# Patient Record
Sex: Female | Born: 1980 | Race: White | Hispanic: No | Marital: Married | Smoking: Current every day smoker
Health system: Southern US, Community
[De-identification: ages and names within clinical notes are randomized; demographics above are authoritative.]

## PROBLEM LIST (undated history)

## (undated) DIAGNOSIS — R079 Chest pain, unspecified: Secondary | ICD-10-CM

## (undated) DIAGNOSIS — O24419 Gestational diabetes mellitus in pregnancy, unspecified control: Secondary | ICD-10-CM

## (undated) DIAGNOSIS — Z789 Other specified health status: Secondary | ICD-10-CM

## (undated) DIAGNOSIS — Z87442 Personal history of urinary calculi: Secondary | ICD-10-CM

## (undated) HISTORY — PX: OTHER SURGICAL HISTORY: SHX169

## (undated) HISTORY — DX: Chest pain, unspecified: R07.9

## (undated) HISTORY — DX: Other specified health status: Z78.9

## (undated) HISTORY — DX: Personal history of urinary calculi: Z87.442

## (undated) HISTORY — DX: Gestational diabetes mellitus in pregnancy, unspecified control: O24.419

---

## 2012-09-09 DIAGNOSIS — R87629 Unspecified abnormal cytological findings in specimens from vagina: Secondary | ICD-10-CM

## 2012-09-09 HISTORY — DX: Unspecified abnormal cytological findings in specimens from vagina: R87.629

## 2016-08-15 DIAGNOSIS — F17200 Nicotine dependence, unspecified, uncomplicated: Secondary | ICD-10-CM | POA: Insufficient documentation

## 2019-12-06 ENCOUNTER — Emergency Department (HOSPITAL_COMMUNITY): Payer: Managed Care, Other (non HMO)

## 2019-12-06 ENCOUNTER — Encounter (HOSPITAL_COMMUNITY): Payer: Self-pay | Admitting: Emergency Medicine

## 2019-12-06 ENCOUNTER — Other Ambulatory Visit: Payer: Self-pay

## 2019-12-06 ENCOUNTER — Emergency Department (HOSPITAL_COMMUNITY)
Admission: EM | Admit: 2019-12-06 | Discharge: 2019-12-07 | Disposition: A | Discharge status: Home / Self Care | Payer: Managed Care, Other (non HMO) | Attending: Emergency Medicine | Admitting: Emergency Medicine

## 2019-12-06 DIAGNOSIS — R0602 Shortness of breath: Secondary | ICD-10-CM | POA: Insufficient documentation

## 2019-12-06 DIAGNOSIS — R002 Palpitations: Secondary | ICD-10-CM | POA: Insufficient documentation

## 2019-12-06 DIAGNOSIS — F17200 Nicotine dependence, unspecified, uncomplicated: Secondary | ICD-10-CM | POA: Insufficient documentation

## 2019-12-06 DIAGNOSIS — R0789 Other chest pain: Secondary | ICD-10-CM

## 2019-12-06 LAB — BASIC METABOLIC PANEL
Anion gap: 9 (ref 5–15)
BUN: 7 mg/dL (ref 6–20)
CO2: 23 mmol/L (ref 22–32)
Calcium: 9.1 mg/dL (ref 8.9–10.3)
Chloride: 103 mmol/L (ref 98–111)
Creatinine, Ser: 0.65 mg/dL (ref 0.44–1.00)
GFR calc Af Amer: >60 mL/min (ref >60)
GFR calc non Af Amer: >60 mL/min (ref >60)
Glucose, Bld: 95 mg/dL (ref 70–99)
Potassium: 3.9 mmol/L (ref 3.5–5.1)
Sodium: 135 mmol/L (ref 135–145)

## 2019-12-06 LAB — CBC
HCT: 44.1 % (ref 36.0–46.0)
Hemoglobin: 15.3 g/dL — ABNORMAL HIGH (ref 12.0–15.0)
MCH: 32.4 pg (ref 26.0–34.0)
MCHC: 34.7 g/dL (ref 30.0–36.0)
MCV: 93.4 fL (ref 80.0–100.0)
Platelets: 310 10*3/uL (ref 150–400)
RBC: 4.72 MIL/uL (ref 3.87–5.11)
RDW: 12.9 % (ref 11.5–15.5)
WBC: 10.4 10*3/uL (ref 4.0–10.5)
nRBC: 0 % (ref 0.0–0.2)

## 2019-12-06 LAB — I-STAT BETA HCG BLOOD, ED (MC, WL, AP ONLY): I-stat hCG, quantitative: <5 m[IU]/mL (ref <5)

## 2019-12-06 LAB — TROPONIN I (HIGH SENSITIVITY)
Troponin I (High Sensitivity): 3 ng/L (ref <18)
Troponin I (High Sensitivity): 4 ng/L (ref <18)

## 2019-12-06 MED ORDER — SODIUM CHLORIDE 0.9% FLUSH: 3.0000 mL | Freq: Once | INTRAVENOUS | Status: DC

## 2019-12-06 NOTE — ED Triage Notes (Signed)
Pt to ED with c/o left chest pain onset this am.  Pt also c/o feeling shaky and off balance.  St's symptoms come and go

## 2019-12-06 NOTE — ED Notes (Signed)
Pt called for VS, no answer  

## 2019-12-07 MED ORDER — KETOROLAC TROMETHAMINE 60 MG/2ML IM SOLN
30.0000 mg | Freq: Once | INTRAMUSCULAR | Status: AC
  Administered 2019-12-07: 30 mg via INTRAMUSCULAR
  Filled 2019-12-07: qty 2

## 2019-12-07 NOTE — Discharge Instructions (Addendum)
Thank you for allowing me to care for you today in the Emergency Department.   You were seen today for chest pain.  Your work-up in the ER, as we discussed, was reassuring.  You can take 600 mg of ibuprofen with food every 6 hours as needed for pain.  You can follow-up with cardiology regarding the palpitations that you have been having for the last few months.  Their contact information is above.  Page 11 on your discharge paperwork has information on how to get set up with a primary care provider.  Try to cut back on smoking gradually.  For instance, if you smoke 10 cigarettes every day this week, next week try smoking only 9 cigarettes/day, then the next week cut back to 8 cigarettes/day.  You can also discuss other smoking cessation options with primary care.  Return to the emergency department if you develop severe difficulty breathing, if you pass out, if you develop severe chest pain that is constant with constant dizziness or lightheadedness, or other new, concerning symptoms.

## 2019-12-07 NOTE — ED Provider Notes (Signed)
[facility] EMERGENCY DEPARTMENT Provider Note   CSN: [id number] Arrival date & time: 12/06/19  1633     History Chief Complaint  Patient presents with  . Chest Pain    Nancy Peterson is a 39 y.o. female with a history of tobacco use disorder who presents to the emergency department with a chief complaint of chest pain.  The patient reports that she developed squeezing chest pain in the center of her chest that began on 2/28 at 10AM, more than 36 hours.  Pain has been constant since onset.  Pain is not pleuritic or exertional.  No known aggravating or alleviating factors. She has attempted to take Tums and Tylenol with no improvement in pain.  She reports that she has had mild dyspnea on exertion since onset of pain, which she noticed when she was walking from the parking lot into the ER.  She had a headache yesterday, but this has since resolved.  No cough, dizziness, lightheadedness, fever, chills, leg swelling, abdominal pain, nausea, vomiting, numbness, weakness, paresthesias, back pain, neck pain, or arm pain, or rashes.  No increased burping or belching.  No dizziness or lightheadedness.  No history of similar.  She reports that she has had palpitations that will last for less than 5 seconds and are accompanied by dizziness and lightheadedness for months.  Reports that she will have these episodes approximately 3-4 times per week.  Symptoms typically begin at rest.  She denies any symptoms at this time.  She does report that she drinks multiple caffeinated beverages daily.  Reports that she has been trying to cut back on caffeinated sodas at this time.  She reports that both of her parents had a history of atrial fibrillation.  She does not take oral contraceptives.  No recent surgery, immobilization, or travel.  No personal or familial history of VTE.  She does report that she has been under an increased amount of stress recently as one of her children is autistic and has  been in and out of the emergency department several times recently.  She has had 2 - Covid test over the last few months and has no known sick contacts at this time.  She does report that she is a current, every day tobacco user.  She has a history of childhood cough variant asthma, but has had no URI symptoms over the last week.  She has not established with primary care.  She does report that she had several alcoholic beverages the night before her pain began, but has not consumed any alcohol since that time.  Denies any other recreational or illicit substance use.  No other chronic medical conditions.  The history is provided by the patient. No language interpreter was used.  Chest Pain Associated symptoms: headache (resolved), palpitations (chronic, intermittent) and shortness of breath   Associated symptoms: no abdominal pain, no back pain, no dizziness, no dysphagia, no fever, no nausea, no vomiting and no weakness        History reviewed. No pertinent past medical history.  There are no problems to display for this patient.   History reviewed. No pertinent surgical history.   OB History   No obstetric history on file.     No family history on file.  Social History   Tobacco Use  . Smoking status: Current Every Day Smoker  . Smokeless tobacco: Never Used  Substance Use Topics  . Alcohol use: Yes  . Drug use: Never    Home  Medications Prior to Admission medications   Not on File    Allergies    Patient has no allergy information on record.  Review of Systems   Review of Systems  Constitutional: Negative for activity change, chills and fever.  HENT: Negative for congestion, sore throat, trouble swallowing and voice change.   Respiratory: Positive for shortness of breath.   Cardiovascular: Positive for chest pain and palpitations (chronic, intermittent). Negative for leg swelling.  Gastrointestinal: Negative for abdominal pain, diarrhea, nausea and vomiting.    Genitourinary: Negative for dysuria, frequency and urgency.  Musculoskeletal: Negative for arthralgias, back pain, gait problem, myalgias, neck pain and neck stiffness.  Skin: Negative for rash.  Allergic/Immunologic: Negative for immunocompromised state.  Neurological: Positive for headaches (resolved). Negative for dizziness, seizures, syncope, speech difficulty, weakness and light-headedness.  Psychiatric/Behavioral: Negative for confusion.    Physical Exam Updated Vital Signs BP 124/70   Pulse 86   Temp 98.3 F (36.8 C) (Oral)   Resp 16   Ht 5\' 4"  (1.626 m)   Wt 79.4 kg   LMP 11/08/2019 (Exact Date)   SpO2 100%   BMI 30.04 kg/m   Physical Exam Vitals and nursing note reviewed.  Constitutional:      General: She is not in acute distress.    Appearance: She is not ill-appearing, toxic-appearing or diaphoretic.     Comments: Anxious, but otherwise well-appearing.  HENT:     Head: Normocephalic.  Eyes:     Conjunctiva/sclera: Conjunctivae normal.  Cardiovascular:     Rate and Rhythm: Normal rate and regular rhythm.     Pulses: Normal pulses.     Heart sounds: Normal heart sounds. No murmur. No friction rub. No gallop.      Comments: Peripheral pulses are 2+ and symmetric. Pulmonary:     Effort: Pulmonary effort is normal. No respiratory distress.     Breath sounds: No stridor. No wheezing, rhonchi or rales.     Comments: Lungs are clear to auscultation bilaterally.  No increased work of breathing. Chest:     Chest wall: No tenderness.  Abdominal:     General: There is no distension.     Palpations: Abdomen is soft. There is no mass.     Tenderness: There is no abdominal tenderness. There is no right CVA tenderness, left CVA tenderness, guarding or rebound.     Hernia: No hernia is present.  Musculoskeletal:     Cervical back: Neck supple.  Skin:    General: Skin is warm.     Findings: No rash.  Neurological:     Mental Status: She is alert.  Psychiatric:         Behavior: Behavior normal.     ED Results / Procedures / Treatments   Labs (all labs ordered are listed, but only abnormal results are displayed) Labs Reviewed  CBC - Abnormal; Notable for the following components:      Result Value   Hemoglobin 15.3 (*)    All other components within normal limits  BASIC METABOLIC PANEL  I-STAT BETA HCG BLOOD, ED ([facility], [facility], [facility] ONLY)  TROPONIN I (HIGH SENSITIVITY)  TROPONIN I (HIGH SENSITIVITY)    EKG EKG Interpretation  Date/Time:  Monday December 06 2019 17:06:08 EDT Ventricular Rate:  74 PR Interval:  148 QRS Duration: 84 QT Interval:  392 QTC Calculation: 435 R Axis:   72 Text Interpretation: Normal sinus rhythm with sinus arrhythmia Normal ECG No old tracing to compare Confirmed by Merrily Pew [phone])  on 12/07/2019 12:38:19 AM   Radiology DG Chest 2 View  Result Date: 12/06/2019 CLINICAL DATA:  Chest pain EXAM: CHEST - 2 VIEW COMPARISON:  None. FINDINGS: The heart size and mediastinal contours are within normal limits. Both lungs are clear. The visualized skeletal structures are unremarkable. IMPRESSION: No active cardiopulmonary disease. Electronically Signed   By: Katherine Mantle M.D.   On: 12/06/2019 17:56    Procedures Procedures (including critical care time)  Medications Ordered in ED Medications  sodium chloride flush (NS) 0.9 % injection 3 mL (3 mLs Intravenous Not Given 12/07/19 0125)  ketorolac (TORADOL) injection 30 mg (30 mg Intramuscular Given 12/07/19 0127)    ED Course  I have reviewed the triage vital signs and the nursing notes.  Pertinent labs & imaging results that were available during my care of the patient were reviewed by me and considered in my medical decision making (see chart for details).    MDM Rules/Calculators/A&P                      39 year old female with a history of tobacco use disorder presenting with squeezing, central chest pain since yesterday.  She noticed that she felt mildly short  of breath when she was walking through the parking lot into the ER.  She had a headache yesterday that is since resolved.  No other associated symptoms.  Vital signs are unremarkable.  EKG with normal sinus rhythm.  She has been having intermittent episodes of palpitations over the last few months, but is having none at this time.  No evidence of Brugada syndrome on EKG.  Troponin is normal.  Heart score is 2.  Labs are otherwise reassuring.  Chest x-ray is unremarkable.  She is PERC negative.  Doubt ACS, PE, tension pneumothorax, aortic dissection, or pneumonia.  She was given Toradol and on reevaluation pain has improved.  Symptoms are very atypical for ACS.  Any she would benefit from getting established with primary care for regular follow-up.  She also has been having intermittent episodes of palpitations for the last few months.  No associated episodes at this time.  Will refer to cardiology as she may benefit from Holter monitor.  I also offered the patient COVID-19 testing given that she has had chest pain, but declined.  Patient was ambulated in the ER without hypoxia.  After ambulating, she reports that she did not feel short of breath during this..  Given improvement with Toradol and reassuring work-up, will discharge patient at this time to home.  She is hemodynamically stable and in no acute distress.  All questions answered.  Safe for discharge home with outpatient follow-up.   Final Clinical Impression(s) / ED Diagnoses Final diagnoses:  Atypical chest pain  Palpitations    Rx / DC Orders ED Discharge Orders    None       Barkley Boards, PA-C 12/07/19 0253    Mesner, Barbara Cower, MD 12/07/19 [phone]

## 2019-12-07 NOTE — ED Notes (Signed)
Pt O2 sats remained at 100% while ambulating

## 2019-12-24 ENCOUNTER — Ambulatory Visit: Payer: Managed Care, Other (non HMO) | Admitting: Cardiology

## 2020-01-13 DIAGNOSIS — R072 Precordial pain: Secondary | ICD-10-CM | POA: Insufficient documentation

## 2020-01-13 DIAGNOSIS — Z7189 Other specified counseling: Secondary | ICD-10-CM | POA: Insufficient documentation

## 2020-01-13 NOTE — Progress Notes (Deleted)
    Cardiology Office Note   Date:  01/13/2020   ID:  [name], DOB [birth year], MRN [id number]  PCP:  Patient, No Pcp Per  Cardiologist:   No primary care provider on file. Referring:  ***  No chief complaint on file.     History of Present Illness: Nancy Peterson is a 39 y.o. female who is referred by *** for evaluation of chest pain.  She was in the ED with this.  I reviewed these records for this visit.  Her HEART score was 2.  ***  Past Medical History:  Diagnosis Date  . Chest pain     No past surgical history on file.   No current outpatient medications on file.   No current facility-administered medications for this visit.    Allergies:   Patient has no allergy information on record.    Social History:  The patient  reports that she has been smoking. She has never used smokeless tobacco. She reports current alcohol use. She reports that she does not use drugs.   Family History:  The patient's ***family history is not on file.    ROS:  Please see the history of present illness.   Otherwise, review of systems are positive for {NONE DEFAULTED:18576::"none"}.   All other systems are reviewed and negative.    PHYSICAL EXAM: VS:  There were no vitals taken for this visit. , BMI There is no height or weight on file to calculate BMI. GENERAL:  Well appearing HEENT:  Pupils equal round and reactive, fundi not visualized, oral mucosa unremarkable NECK:  No jugular venous distention, waveform within normal limits, carotid upstroke brisk and symmetric, no bruits, no thyromegaly LYMPHATICS:  No cervical, inguinal adenopathy LUNGS:  Clear to auscultation bilaterally BACK:  No CVA tenderness CHEST:  Unremarkable HEART:  PMI not displaced or sustained,S1 and S2 within normal limits, no S3, no S4, no clicks, no rubs, *** murmurs ABD:  Flat, positive bowel sounds normal in frequency in pitch, no bruits, no rebound, no guarding, no midline pulsatile mass, no hepatomegaly, no  splenomegaly EXT:  2 plus pulses throughout, no edema, no cyanosis no clubbing SKIN:  No rashes no nodules NEURO:  Cranial nerves II through XII grossly intact, motor grossly intact throughout PSYCH:  Cognitively intact, oriented to person place and time    EKG:  EKG {ACTION; IS/IS OEU:[id number]} ordered today. The ekg ordered today demonstrates ***   Recent Labs: 12/06/2019: BUN 7; Creatinine, Ser 0.65; Hemoglobin 15.3; Platelets 310; Potassium 3.9; Sodium 135    Lipid Panel No results found for: CHOL, TRIG, HDL, CHOLHDL, VLDL, LDLCALC, LDLDIRECT    Wt Readings from Last 3 Encounters:  12/06/19 175 lb (79.4 kg)      Other studies Reviewed: Additional studies/ records that were reviewed today include: ***. Review of the above records demonstrates:  Please see elsewhere in the note.  ***   ASSESSMENT AND PLAN:  CHEST PAIN:  ***  COVID EDUCATION:  ***   Current medicines are reviewed at length with the patient today.  The patient {ACTIONS; HAS/DOES NOT HAVE:19233} concerns regarding medicines.  The following changes have been made:  {PLAN; NO CHANGE:13088:s}  Labs/ tests ordered today include: *** No orders of the defined types were placed in this encounter.    Disposition:   FU with ***    Signed, Rollene Rotunda, MD  01/13/2020 9:24 PM    [facility]

## 2020-01-14 ENCOUNTER — Ambulatory Visit: Payer: Managed Care, Other (non HMO) | Admitting: Cardiology

## 2020-01-18 ENCOUNTER — Encounter: Payer: Self-pay | Admitting: General Practice

## 2020-04-13 ENCOUNTER — Emergency Department (HOSPITAL_COMMUNITY)
Admission: EM | Admit: 2020-04-13 | Discharge: 2020-04-13 | Disposition: A | Discharge status: Home / Self Care | Payer: Managed Care, Other (non HMO) | Attending: Emergency Medicine | Admitting: Emergency Medicine

## 2020-04-13 ENCOUNTER — Emergency Department (HOSPITAL_COMMUNITY): Payer: Managed Care, Other (non HMO)

## 2020-04-13 ENCOUNTER — Encounter (HOSPITAL_COMMUNITY): Payer: Self-pay | Admitting: *Deleted

## 2020-04-13 DIAGNOSIS — F17200 Nicotine dependence, unspecified, uncomplicated: Secondary | ICD-10-CM | POA: Diagnosis not present

## 2020-04-13 DIAGNOSIS — R1032 Left lower quadrant pain: Secondary | ICD-10-CM | POA: Diagnosis not present

## 2020-04-13 DIAGNOSIS — N139 Obstructive and reflux uropathy, unspecified: Secondary | ICD-10-CM | POA: Diagnosis not present

## 2020-04-13 DIAGNOSIS — Z79899 Other long term (current) drug therapy: Secondary | ICD-10-CM | POA: Insufficient documentation

## 2020-04-13 DIAGNOSIS — R1012 Left upper quadrant pain: Secondary | ICD-10-CM | POA: Diagnosis not present

## 2020-04-13 DIAGNOSIS — R319 Hematuria, unspecified: Secondary | ICD-10-CM | POA: Diagnosis not present

## 2020-04-13 DIAGNOSIS — R300 Dysuria: Secondary | ICD-10-CM | POA: Diagnosis not present

## 2020-04-13 DIAGNOSIS — R1030 Lower abdominal pain, unspecified: Secondary | ICD-10-CM | POA: Diagnosis present

## 2020-04-13 DIAGNOSIS — N135 Crossing vessel and stricture of ureter without hydronephrosis: Secondary | ICD-10-CM

## 2020-04-13 LAB — BASIC METABOLIC PANEL
Anion gap: 8 (ref 5–15)
BUN: 5 mg/dL — ABNORMAL LOW (ref 6–20)
CO2: 25 mmol/L (ref 22–32)
Calcium: 10 mg/dL (ref 8.9–10.3)
Chloride: 104 mmol/L (ref 98–111)
Creatinine, Ser: 0.62 mg/dL (ref 0.44–1.00)
GFR calc Af Amer: >60 mL/min (ref >60)
GFR calc non Af Amer: >60 mL/min (ref >60)
Glucose, Bld: 68 mg/dL — ABNORMAL LOW (ref 70–99)
Potassium: 4.1 mmol/L (ref 3.5–5.1)
Sodium: 137 mmol/L (ref 135–145)

## 2020-04-13 LAB — CBG MONITORING, ED: Glucose-Capillary: 95 mg/dL (ref 70–99)

## 2020-04-13 LAB — I-STAT BETA HCG BLOOD, ED (MC, WL, AP ONLY): I-stat hCG, quantitative: <5 m[IU]/mL (ref <5)

## 2020-04-13 LAB — URINALYSIS, ROUTINE W REFLEX MICROSCOPIC
Bilirubin Urine: NEGATIVE
Glucose, UA: NEGATIVE mg/dL
Ketones, ur: NEGATIVE mg/dL
Leukocytes,Ua: NEGATIVE
Nitrite: NEGATIVE
Protein, ur: NEGATIVE mg/dL
Specific Gravity, Urine: 1.002 — ABNORMAL LOW (ref 1.005–1.030)
pH: 7 (ref 5.0–8.0)

## 2020-04-13 LAB — CBC
HCT: 45.3 % (ref 36.0–46.0)
Hemoglobin: 15.4 g/dL — ABNORMAL HIGH (ref 12.0–15.0)
MCH: 32.3 pg (ref 26.0–34.0)
MCHC: 34 g/dL (ref 30.0–36.0)
MCV: 95 fL (ref 80.0–100.0)
Platelets: 336 10*3/uL (ref 150–400)
RBC: 4.77 MIL/uL (ref 3.87–5.11)
RDW: 12.9 % (ref 11.5–15.5)
WBC: 11 10*3/uL — ABNORMAL HIGH (ref 4.0–10.5)
nRBC: 0 % (ref 0.0–0.2)

## 2020-04-13 MED ORDER — CIPROFLOXACIN HCL 500 MG PO TABS: 500.0000 mg | ORAL_TABLET | Freq: Two times a day (BID) | ORAL | 0 refills | Status: DC

## 2020-04-13 MED ORDER — HYDROCODONE-ACETAMINOPHEN 5-325 MG PO TABS: 1.0000 | ORAL_TABLET | Freq: Three times a day (TID) | ORAL | 0 refills | Status: DC | PRN

## 2020-04-13 MED ORDER — ONDANSETRON 4 MG PO TBDP
4.0000 mg | ORAL_TABLET | Freq: Once | ORAL | Status: AC
  Administered 2020-04-13: 4 mg via ORAL
  Filled 2020-04-13: qty 1

## 2020-04-13 MED ORDER — ONDANSETRON HCL 4 MG PO TABS: 4.0000 mg | ORAL_TABLET | Freq: Four times a day (QID) | ORAL | 0 refills | Status: DC

## 2020-04-13 MED ORDER — CIPROFLOXACIN HCL 500 MG PO TABS
500.0000 mg | ORAL_TABLET | Freq: Two times a day (BID) | ORAL | 0 refills | Status: AC
Start: 2020-04-13 — End: 2020-04-20

## 2020-04-13 MED ORDER — ONDANSETRON HCL 4 MG PO TABS
4.0000 mg | ORAL_TABLET | Freq: Four times a day (QID) | ORAL | 0 refills | Status: DC
Start: 2020-04-13 — End: 2021-03-27

## 2020-04-13 MED ORDER — KETOROLAC TROMETHAMINE 30 MG/ML IJ SOLN
30.0000 mg | Freq: Once | INTRAMUSCULAR | Status: AC
  Administered 2020-04-13: 30 mg via INTRAMUSCULAR
  Filled 2020-04-13: qty 1

## 2020-04-13 NOTE — ED Triage Notes (Signed)
To ED for eval right flank pain. States she has been on 7 days of abx. Told if she developed flank pain to come to the ED. No fevers.

## 2020-04-13 NOTE — ED Notes (Signed)
Pt tolerating PO intake

## 2020-04-13 NOTE — ED Provider Notes (Signed)
[facility] EMERGENCY DEPARTMENT Provider Note   CSN: [id number] Arrival date & time: 04/13/20  1002     History Chief Complaint  Patient presents with  . Flank Pain    Nancy Peterson is a 39 y.o. female.  HPI   39 year old female who presents to the emergency department today for evaluation of left flank pain.  She states that 1 week ago she started having dysuria, frequency, urgency and hematuria.  She has had UTIs in the past and felt that her symptoms were similar so she had a telehealth visit with a provider who started her on Macrobid.  States she just finished this medication but is now developed left flank pain that started this morning.  Symptoms have been intermittent since onset.  States pain radiates around to the left side of the abdomen.  She also had some nausea but no episodes of vomiting.  Denies any diarrhea or constipation.  She has never had a kidney stone in the past.  Past Medical History:  Diagnosis Date  . Chest pain     Patient Active Problem List   Diagnosis Date Noted  . Precordial chest pain 01/13/2020  . Educated about COVID-19 virus infection 01/13/2020    History reviewed. No pertinent surgical history.   OB History   No obstetric history on file.     No family history on file.  Social History   Tobacco Use  . Smoking status: Current Every Day Smoker  . Smokeless tobacco: Never Used  Substance Use Topics  . Alcohol use: Yes  . Drug use: Never    Home Medications Prior to Admission medications   Medication Sig Start Date End Date Taking? Authorizing Provider  CRANBERRY PO Take 2 tablets by mouth daily.   Yes [provider]  ciprofloxacin (CIPRO) 500 MG tablet Take 1 tablet (500 mg total) by mouth every 12 (twelve) hours for 7 days. 04/13/20 04/20/20  Tyler Cubit S, PA-C  HYDROcodone-acetaminophen (NORCO/VICODIN) 5-325 MG tablet Take 1 tablet by mouth every 8 (eight) hours as needed. 04/13/20   Jahson Emanuele  S, PA-C  ondansetron (ZOFRAN) 4 MG tablet Take 1 tablet (4 mg total) by mouth every 6 (six) hours. 04/13/20   Shooter Tangen S, PA-C    Allergies    Penicillins  Review of Systems   Review of Systems  Constitutional: Negative for fever.  HENT: Negative for ear pain and sore throat.   Eyes: Negative for visual disturbance.  Respiratory: Negative for cough and shortness of breath.   Cardiovascular: Negative for chest pain.  Gastrointestinal: Positive for abdominal pain and nausea. Negative for constipation, diarrhea and vomiting.  Genitourinary: Positive for dysuria, flank pain, frequency, hematuria and urgency.  Musculoskeletal: Negative for back pain.  Skin: Negative for rash.  Neurological: Negative for headaches.  All other systems reviewed and are negative.   Physical Exam Updated Vital Signs BP 132/89 (BP Location: Right Arm)   Pulse 64   Temp 98.2 F (36.8 C) (Oral)   Resp 18   LMP 03/09/2020 (Approximate)   SpO2 97%   Physical Exam Vitals and nursing note reviewed.  Constitutional:      General: She is not in acute distress.    Appearance: She is well-developed.  HENT:     Head: Normocephalic and atraumatic.  Eyes:     Conjunctiva/sclera: Conjunctivae normal.  Cardiovascular:     Rate and Rhythm: Normal rate and regular rhythm.     Heart sounds: Normal heart sounds.  No murmur heard.   Pulmonary:     Effort: Pulmonary effort is normal. No respiratory distress.     Breath sounds: Normal breath sounds. No wheezing, rhonchi or rales.  Abdominal:     General: Bowel sounds are normal.     Palpations: Abdomen is soft.     Tenderness: There is abdominal tenderness (LUQ, LLQ). There is left CVA tenderness. There is no right CVA tenderness, guarding or rebound.  Musculoskeletal:     Cervical back: Neck supple.  Skin:    General: Skin is warm and dry.  Neurological:     Mental Status: She is alert.     ED Results / Procedures / Treatments   Labs (all labs  ordered are listed, but only abnormal results are displayed) Labs Reviewed  URINALYSIS, ROUTINE W REFLEX MICROSCOPIC - Abnormal; Notable for the following components:      Result Value   Color, Urine STRAW (*)    Specific Gravity, Urine 1.002 (*)    Hgb urine dipstick LARGE (*)    Bacteria, UA RARE (*)    All other components within normal limits  CBC - Abnormal; Notable for the following components:   WBC 11.0 (*)    Hemoglobin 15.4 (*)    All other components within normal limits  BASIC METABOLIC PANEL - Abnormal; Notable for the following components:   Glucose, Bld 68 (*)    BUN 5 (*)    All other components within normal limits  URINE CULTURE  I-STAT BETA HCG BLOOD, ED ([facility], [facility], [facility] ONLY)  CBG MONITORING, ED    EKG None  Radiology CT Renal Stone Study  Result Date: 04/13/2020 CLINICAL DATA:  Right flank pain EXAM: CT ABDOMEN AND PELVIS WITHOUT CONTRAST TECHNIQUE: Multidetector CT imaging of the abdomen and pelvis was performed following the standard protocol without IV contrast. COMPARISON:  Chest radiograph 12/06/2019 FINDINGS: Lower chest: Lung bases are clear. Normal heart size. No pericardial effusion. Hepatobiliary: No visible concerning focal liver lesions on this unenhanced CT. Gradient density within the gallbladder may reflect some layering biliary sludge. No pericholecystic fluid or inflammation. No biliary ductal dilatation or visible calcified gallstones. Pancreas: Unremarkable. No pancreatic ductal dilatation or surrounding inflammatory changes. Spleen: Normal in size. No concerning splenic lesions. Adrenals/Urinary Tract: Normal adrenal glands. Kidneys are symmetric in size and normally located. No acute abnormality of the right kidney. Multiple nonobstructing calculi present in the left kidney. Slight prominence of the left renal pelvis with abrupt transition to more decompressed ureter at the level of the ureteropelvic junction, could reflect a mild UPJ obstruction.  Multiple phleboliths noted in the vicinity of the distal left ureter but without visible distal ureteral calculus convincingly demonstrated. Urinary bladder is largely decompressed at the time of exam and therefore poorly evaluated by CT imaging. Mild wall thickening is nonspecific and possibly related to underdistention versus cystitis. Stomach/Bowel: Distal esophagus, stomach and duodenal sweep are unremarkable. No small bowel wall thickening or dilatation. No evidence of obstruction. Cecum displaced to the midline pelvis with a normal appendix along the right pelvic sidewall. No colonic dilatation or wall thickening. Vascular/Lymphatic: No significant vascular findings are present. No enlarged abdominal or pelvic lymph nodes. Reproductive: Uterus and bilateral adnexa are unremarkable. Other: No abdominopelvic free fluid or free gas. No bowel containing hernias. Musculoskeletal: No acute osseous abnormality or suspicious osseous lesion. IMPRESSION: 1. Mild wall thickening of the urinary bladder, nonspecific and possibly related to underdistention versus cystitis. 2. No acute abnormality of the right kidney  or ureter. 3. Slight prominence of the left renal pelvis with abrupt transition to more decompressed ureter at the level of the ureteropelvic junction, could reflect a mild UPJ obstruction. 4. Nonobstructing calculi present in left renal pelvis. Multiple phleboliths noted in the vicinity of the distal left ureter but without visible distal ureteral calculus convincingly demonstrated. 5. Gradient density within the gallbladder may reflect some layering biliary sludge. No pericholecystic fluid or inflammation. Electronically Signed   By: Kreg Shropshire M.D.   On: 04/13/2020 18:54    Procedures Procedures (including critical care time)  Medications Ordered in ED Medications  ketorolac (TORADOL) 30 MG/ML injection 30 mg (30 mg Intramuscular Given 04/13/20 1649)  ondansetron (ZOFRAN-ODT) disintegrating tablet 4  mg (4 mg Oral Given 04/13/20 1649)    ED Course  I have reviewed the triage vital signs and the nursing notes.  Pertinent labs & imaging results that were available during my care of the patient were reviewed by me and considered in my medical decision making (see chart for details).    MDM Rules/Calculators/A&P                          39 year old female presenting for evaluation of left flank pain.  Recently treated for UTI with Macrobid.  Reviewed/interpreted labs CBC with mild leukocytosis 11, mildly elevated hemoglobin BMP with normal electrolytes and kidney function.  Blood glucose slightly low initially, patient given p.o. fluids and repeat CBG within normal limits. Beta hcg neg UA with hematuria, otherwise no leuks, nitrites urine culture obtained  CT renal -  1. Mild wall thickening of the urinary bladder, nonspecific and possibly related to underdistention versus cystitis. 2. No acute abnormality of the right kidney or ureter. 3. Slight prominence of the left renal pelvis with abrupt transition to more decompressed ureter at the level of the ureteropelvic junction, could reflect a mild UPJ obstruction. 4. Nonobstructing calculi present in left renal pelvis. Multiple phleboliths noted in the vicinity of the distal left ureter but without visible distal ureteral calculus convincingly demonstrated. 5. Gradient density within the gallbladder may reflect some layering biliary sludge. No pericholecystic fluid or inflammation.   7:28 PM CONSULT with Dr. Benancio Deeds with urology.  He reviewed the patient's work-up and imaging.  He recommends starting the patient on Cipro for 7 days to cover for possible pyelonephritis and also recommends that she follow-up in the office for further evaluation.  Recommends culturing the urine.  Reassessed patient, she has been able to tolerate p.o.  We discussed the findings and plan for discharge with close follow-up with urology.  Discussed plan to start her on  Cipro, pain medications and will also send her home with nausea medications.  Have advised that she call the office tomorrow to schedule appointment for follow-up.  We discussed strict return precautions for any new or worsening symptoms.  She voices understanding of the plan and reasons to return.  All questions answered.  Patient stable for discharge.  Final Clinical Impression(s) / ED Diagnoses Final diagnoses:  Obstruction of left ureter    Rx / DC Orders ED Discharge Orders         Ordered    ciprofloxacin (CIPRO) 500 MG tablet  Every 12 hours     Discontinue  Reprint     04/13/20 1937    HYDROcodone-acetaminophen (NORCO/VICODIN) 5-325 MG tablet  Every 8 hours PRN     Discontinue  Reprint     04/13/20 1937  ondansetron (ZOFRAN) 4 MG tablet  Every 6 hours     Discontinue  Reprint     04/13/20 1937           Rayne DuCouture, Betzabe Bevans S, PA-C 04/13/20 1938    Terald Sleeperrifan, Matthew J, MD 04/13/20 [phone]

## 2020-04-13 NOTE — Discharge Instructions (Addendum)
You were given a prescription for antibiotics. Please take the antibiotic prescription fully.   Prescription given for Norco. Take medication as directed and do not operate machinery, drive a car, or work while taking this medication as it can make you drowsy.   You were given a prescription for zofran to help with your nausea. Please take as directed.  Please follow up with [facility] within 5-7 days for re-evaluation of your symptoms. If you do not have a primary care provider, information for a healthcare clinic has been provided for you to make arrangements for follow up care.    You will need to return to the emergency department for any fevers, persistent pain, persistent vomiting, inability to urinate, or any new or worsening symptoms.

## 2020-04-13 NOTE — ED Notes (Signed)
Patient verbalizes understanding of discharge instructions. Opportunity for questioning and answers were provided. Armband removed by staff, pt discharged from ED ambulatory.   

## 2020-04-14 LAB — URINE CULTURE

## 2021-02-27 ENCOUNTER — Ambulatory Visit (INDEPENDENT_AMBULATORY_CARE_PROVIDER_SITE_OTHER): Payer: Managed Care, Other (non HMO) | Admitting: *Deleted

## 2021-02-27 ENCOUNTER — Other Ambulatory Visit: Payer: Self-pay

## 2021-02-27 VITALS — BP 131/80 | HR 82 | Temp 98.0°F | Ht 63.0 in | Wt 177.0 lb

## 2021-02-27 DIAGNOSIS — Z3480 Encounter for supervision of other normal pregnancy, unspecified trimester: Secondary | ICD-10-CM

## 2021-02-27 DIAGNOSIS — Z3201 Encounter for pregnancy test, result positive: Secondary | ICD-10-CM | POA: Diagnosis not present

## 2021-02-27 DIAGNOSIS — O0990 Supervision of high risk pregnancy, unspecified, unspecified trimester: Secondary | ICD-10-CM | POA: Insufficient documentation

## 2021-02-27 LAB — POCT URINE PREGNANCY: Preg Test, Ur: POSITIVE — AB

## 2021-02-27 MED ORDER — PRENATAL PLUS VITAMIN/MINERAL 27-1 MG PO TABS: 1.0000 | ORAL_TABLET | Freq: Every day | ORAL | 12 refills | Status: AC

## 2021-02-27 NOTE — Progress Notes (Signed)
   Location: [facility] Patient: clinic Provider: clinic  PRENATAL INTAKE SUMMARY  Nancy Peterson presents today New OB Nurse Interview.  OB History     Gravida  3   Para  2   Term  2   Preterm      AB      Living  2      SAB      IAB      Ectopic      Multiple      Live Births  2          I have reviewed the patient's medical, obstetrical, social, and family histories, medications, and available lab results.  SUBJECTIVE She has no unusual complaints. Patient reported she had an abnormal PAP with last pregnancy.   OBJECTIVE Initial Nurse interview for history/labs (New OB)  EDD: 10/15/2021 by LMP GA: [redacted]w[redacted]d G3P2002  GENERAL APPEARANCE: alert, well appearing, in no apparent distress, oriented to person, place and time   ASSESSMENT Positive urine pregnancy test Normal pregnancy  PLAN Prenatal care:  [facility] Labs to be completed at next visit with Dr. Crissie Reese on 03/27/21 at 2:55 PM Patient to sign up for Babyscripts Rx PNV sent to pharmacy Blood pressure monitor given Advised to ask for ROI for previous prenatal records at [facility] appointment  Follow Up Instructions:   I discussed the assessment and treatment plan with the patient. The patient was provided an opportunity to ask questions and all were answered. The patient agreed with the plan and demonstrated an understanding of the instructions.   The patient was advised to call back or seek an in-person evaluation if the symptoms worsen or if the condition fails to improve as anticipated.  I provided 30 minutes of  face-to-face time during this encounter.  Clovis Pu, RN

## 2021-03-22 ENCOUNTER — Encounter (HOSPITAL_COMMUNITY): Payer: Self-pay | Admitting: Obstetrics & Gynecology

## 2021-03-22 ENCOUNTER — Inpatient Hospital Stay (HOSPITAL_COMMUNITY)
Admission: AD | Admit: 2021-03-22 | Discharge: 2021-03-22 | Disposition: A | Discharge status: Home / Self Care | Payer: Managed Care, Other (non HMO) | Attending: Obstetrics & Gynecology | Admitting: Obstetrics & Gynecology

## 2021-03-22 ENCOUNTER — Inpatient Hospital Stay (HOSPITAL_COMMUNITY): Payer: Managed Care, Other (non HMO)

## 2021-03-22 ENCOUNTER — Other Ambulatory Visit: Payer: Self-pay

## 2021-03-22 DIAGNOSIS — O4691 Antepartum hemorrhage, unspecified, first trimester: Secondary | ICD-10-CM

## 2021-03-22 DIAGNOSIS — O99331 Smoking (tobacco) complicating pregnancy, first trimester: Secondary | ICD-10-CM | POA: Insufficient documentation

## 2021-03-22 DIAGNOSIS — F17210 Nicotine dependence, cigarettes, uncomplicated: Secondary | ICD-10-CM | POA: Diagnosis not present

## 2021-03-22 DIAGNOSIS — O208 Other hemorrhage in early pregnancy: Secondary | ICD-10-CM | POA: Diagnosis not present

## 2021-03-22 DIAGNOSIS — O209 Hemorrhage in early pregnancy, unspecified: Secondary | ICD-10-CM

## 2021-03-22 DIAGNOSIS — O418X10 Other specified disorders of amniotic fluid and membranes, first trimester, not applicable or unspecified: Secondary | ICD-10-CM

## 2021-03-22 DIAGNOSIS — Z3A10 10 weeks gestation of pregnancy: Secondary | ICD-10-CM | POA: Insufficient documentation

## 2021-03-22 DIAGNOSIS — O468X1 Other antepartum hemorrhage, first trimester: Secondary | ICD-10-CM

## 2021-03-22 LAB — COMPREHENSIVE METABOLIC PANEL
ALT: 28 U/L (ref 0–44)
AST: 30 U/L (ref 15–41)
Albumin: 3.7 g/dL (ref 3.5–5.0)
Alkaline Phosphatase: 43 U/L (ref 38–126)
Anion gap: 9 (ref 5–15)
BUN: 5 mg/dL — ABNORMAL LOW (ref 6–20)
CO2: 23 mmol/L (ref 22–32)
Calcium: 9.3 mg/dL (ref 8.9–10.3)
Chloride: 101 mmol/L (ref 98–111)
Creatinine, Ser: 0.51 mg/dL (ref 0.44–1.00)
GFR, Estimated: >60 mL/min (ref >60)
Glucose, Bld: 93 mg/dL (ref 70–99)
Potassium: 3.8 mmol/L (ref 3.5–5.1)
Sodium: 133 mmol/L — ABNORMAL LOW (ref 135–145)
Total Bilirubin: 0.5 mg/dL (ref 0.3–1.2)
Total Protein: 6.9 g/dL (ref 6.5–8.1)

## 2021-03-22 LAB — ABO/RH: ABO/RH(D): O POS

## 2021-03-22 LAB — WET PREP, GENITAL
Clue Cells Wet Prep HPF POC: NONE SEEN
Sperm: NONE SEEN
Trich, Wet Prep: NONE SEEN
Yeast Wet Prep HPF POC: NONE SEEN

## 2021-03-22 LAB — HCG, QUANTITATIVE, PREGNANCY: hCG, Beta Chain, Quant, S: 88700 m[IU]/mL — ABNORMAL HIGH (ref <5)

## 2021-03-22 LAB — CBC
HCT: 39.5 % (ref 36.0–46.0)
Hemoglobin: 14.2 g/dL (ref 12.0–15.0)
MCH: 32.6 pg (ref 26.0–34.0)
MCHC: 35.9 g/dL (ref 30.0–36.0)
MCV: 90.6 fL (ref 80.0–100.0)
Platelets: 311 10*3/uL (ref 150–400)
RBC: 4.36 MIL/uL (ref 3.87–5.11)
RDW: 13.1 % (ref 11.5–15.5)
WBC: 13.1 10*3/uL — ABNORMAL HIGH (ref 4.0–10.5)
nRBC: 0 % (ref 0.0–0.2)

## 2021-03-22 NOTE — MAU Provider Note (Signed)
Chief Complaint: Vaginal Bleeding   Event Date/Time   First Provider Initiated Contact with Patient 03/22/21 0202        SUBJECTIVE HPI: Nancy Peterson is a 40 y.o. G3P2002 at [redacted]w[redacted]d by LMP who presents to maternity admissions reporting vaginal bleeding while at work.  Had some cramping but none now. No recent IC. She denies vaginal itching/burning, urinary symptoms, h/a, dizziness, n/v, or fever/chills.    Vaginal Bleeding The patient's primary symptoms include pelvic pain and vaginal bleeding. The patient's pertinent negatives include no genital itching, genital lesions or genital odor. This is a new problem. The current episode started today. The problem occurs intermittently. The problem has been rapidly improving. The patient is experiencing no pain. She is pregnant. Pertinent negatives include no chills, constipation, diarrhea, dysuria, fever, nausea or vomiting. The vaginal discharge was bloody. The vaginal bleeding is heavier than menses. She has been passing clots. She has not been passing tissue. Nothing aggravates the symptoms. She has tried nothing for the symptoms.   RN Note: Patient was at work and began bleeding. She noticed she had soaked her pants with some clots. Has had some abdominal cramping. Last intercourse: last weekend 4/10  Past Medical History:  Diagnosis Date   Chest pain    Medical history non-contributory    Past Surgical History:  Procedure Laterality Date   KidneyStone Removal     Social History   Socioeconomic History   Marital status: Married    Spouse name: Not on file   Number of children: 2   Years of education: Not on file   Highest education level: Not on file  Occupational History   Not on file  Tobacco Use   Smoking status: Every Day    Types: Cigarettes   Smokeless tobacco: Never  Vaping Use   Vaping Use: Never used  Substance and Sexual Activity   Alcohol use: Not Currently   Drug use: Never   Sexual activity: Yes    Birth  control/protection: None  Other Topics Concern   Not on file  Social History Narrative   Not on file   Social Determinants of Health   Financial Resource Strain: Not on file  Food Insecurity: Not on file  Transportation Needs: Not on file  Physical Activity: Not on file  Stress: Not on file  Social Connections: Not on file  Intimate Partner Violence: Not on file   No current facility-administered medications on file prior to encounter.   Current Outpatient Medications on File Prior to Encounter  Medication Sig Dispense Refill   HYDROcodone-acetaminophen (NORCO/VICODIN) 5-325 MG tablet Take 1 tablet by mouth every 8 (eight) hours as needed. 6 tablet 0   ondansetron (ZOFRAN) 4 MG tablet Take 1 tablet (4 mg total) by mouth every 6 (six) hours. 12 tablet 0   Prenatal Vit-Fe Fumarate-FA (PRENATAL PLUS VITAMIN/MINERAL) 27-1 MG TABS Take 1 tablet by mouth daily at 6 (six) AM. 30 tablet 12   Allergies  Allergen Reactions   Penicillins Hives and Rash    I have reviewed patient's Past Medical Hx, Surgical Hx, Family Hx, Social Hx, medications and allergies.   ROS:  Review of Systems  Constitutional:  Negative for chills and fever.  Gastrointestinal:  Negative for constipation, diarrhea, nausea and vomiting.  Genitourinary:  Positive for pelvic pain and vaginal bleeding. Negative for dysuria.  Review of Systems  Other systems negative   Physical Exam  Physical Exam Patient Vitals for the past 24 hrs:  BP Temp Temp  src Pulse Resp SpO2 Height Weight  03/22/21 0156 (!) 141/67 98.1 F (36.7 C) Oral 77 19 100 % 5\' 3"  (1.6 m) 79.9 kg   Constitutional: Well-developed, well-nourished female in no acute distress.  Cardiovascular: normal rate Respiratory: normal effort GI: Abd soft, non-tender. Pos BS x 4 MS: Extremities nontender, no edema, normal ROM Neurologic: Alert and oriented x 4.  GU: Neg CVAT.  PELVIC EXAM: Cervix pink, visually closed, without lesion, small amount of  bloody discharge, vaginal walls and external genitalia normal Bimanual exam: Cervix 0/long/high, firm, anterior, neg CMT, uterus nontender, enlarged, adnexa without tenderness, enlargement, or mass  LAB RESULTS Results for orders placed or performed during the hospital encounter of 03/22/21 (from the past 24 hour(s))  ABO/Rh     Status: None   Collection Time: 03/22/21  2:25 AM  Result Value Ref Range   ABO/RH(D) O POS    No rh immune globuloin      NOT A RH IMMUNE GLOBULIN CANDIDATE, PT RH POSITIVE Performed at [facility], [address], [place], [place]   CBC     Status: Abnormal   Collection Time: 03/22/21  2:25 AM  Result Value Ref Range   WBC 13.1 (H) 4.0 - 10.5 K/uL   RBC 4.36 3.87 - 5.11 MIL/uL   Hemoglobin 14.2 12.0 - 15.0 g/dL   HCT 03/24/21 35.3 - 29.9 %   MCV 90.6 80.0 - 100.0 fL   MCH 32.6 26.0 - 34.0 pg   MCHC 35.9 30.0 - 36.0 g/dL   RDW 24.2 68.3 - 41.9 %   Platelets 311 150 - 400 K/uL   nRBC 0.0 0.0 - 0.2 %  Wet prep, genital     Status: Abnormal   Collection Time: 03/22/21  2:42 AM   Specimen: Vaginal  Result Value Ref Range   Yeast Wet Prep HPF POC NONE SEEN NONE SEEN   Trich, Wet Prep NONE SEEN NONE SEEN   Clue Cells Wet Prep HPF POC NONE SEEN NONE SEEN   WBC, Wet Prep HPF POC MODERATE (A) NONE SEEN   Sperm NONE SEEN     IMAGING 03/24/21 OB Comp Less 14 Wks  Result Date: 03/22/2021 CLINICAL DATA:  Heavy vaginal bleeding, G3P2 EXAM: OBSTETRIC <14 WK ULTRASOUND TECHNIQUE: Transabdominal ultrasound was performed for evaluation of the gestation as well as the maternal uterus and adnexal regions. COMPARISON:  CT 04/13/2020 FINDINGS: Intrauterine gestational sac: Single Yolk sac:  Not Visualized. Embryo:  Visualized. Cardiac Activity: Visualized. Heart Rate: 169 bpm CRL:   26.5 mm   9 w 3 d                  06/13/2020 EDC: 10/22/2021 Subchorionic hemorrhage: Small volume subchorionic hemorrhage is noted measuring 1.9 x 1 x 1.7 cm in size (images 36, 37) Maternal  uterus/adnexae: Normal appearance of the right ovary. Left ovary is not visualized. No free pelvic fluid. IMPRESSION: 1. Single intrauterine gestation at 9 weeks, 3 days. 2. Nonvisualization of the yolk sac is a nonspecific finding, typically seen after 10 weeks of gestation. 3. Small volume subchorionic hemorrhage. Recommend close clinical follow-up with repeat ultrasound as clinically warranted. 4. Nonvisualization of the left ovary. Electronically Signed   By: 10/24/2021 M.D.   On: 03/22/2021 02:53     MAU Management/MDM: Ordered usual first trimester r/o ectopic labs.   Pelvic exam and cultures done Will check baseline Ultrasound to rule out ectopic.or SAB  This bleeding/pain can represent a  normal pregnancy with bleeding, spontaneous abortion or even an ectopic which can be life-threatening.  The process as listed above helps to determine which of these is present.  Reviewed findings with patient  DIscussed she may have some bleeding for the next few weeks  ASSESSMENT Single IUP at [redacted]w[redacted]d Bleeding in first trimester Subchorionic hemorrhage  PLAN Discharge home Bleeding precautions Pelvic rest Pt stable at time of discharge. Encouraged to return here if she develops worsening of symptoms, increase in pain, fever, or other concerning symptoms.    Wynelle Bourgeois CNM, MSN Certified Nurse-Midwife 03/22/2021  2:02 AM

## 2021-03-22 NOTE — MAU Note (Signed)
Patient was at work and began bleeding. She noticed she had soaked her pants with some clots. Has had some abdominal cramping.  Last intercourse: last weekend 4/10

## 2021-03-23 LAB — GC/CHLAMYDIA PROBE AMP (~~LOC~~) NOT AT ARMC
Chlamydia: NEGATIVE
Comment: NEGATIVE
Comment: NORMAL
Neisseria Gonorrhea: NEGATIVE

## 2021-03-27 ENCOUNTER — Ambulatory Visit (INDEPENDENT_AMBULATORY_CARE_PROVIDER_SITE_OTHER): Payer: Managed Care, Other (non HMO) | Admitting: Family Medicine

## 2021-03-27 ENCOUNTER — Other Ambulatory Visit (HOSPITAL_COMMUNITY)
Admission: RE | Admit: 2021-03-27 | Discharge: 2021-03-27 | Disposition: A | Discharge status: Home / Self Care | Payer: Managed Care, Other (non HMO) | Source: Ambulatory Visit | Attending: Family Medicine | Admitting: Family Medicine

## 2021-03-27 ENCOUNTER — Other Ambulatory Visit: Payer: Self-pay

## 2021-03-27 ENCOUNTER — Encounter: Payer: Self-pay | Admitting: Family Medicine

## 2021-03-27 VITALS — BP 117/74 | HR 78 | Wt 174.8 lb

## 2021-03-27 DIAGNOSIS — Z113 Encounter for screening for infections with a predominantly sexual mode of transmission: Secondary | ICD-10-CM | POA: Insufficient documentation

## 2021-03-27 DIAGNOSIS — O09521 Supervision of elderly multigravida, first trimester: Secondary | ICD-10-CM

## 2021-03-27 DIAGNOSIS — O09529 Supervision of elderly multigravida, unspecified trimester: Secondary | ICD-10-CM | POA: Insufficient documentation

## 2021-03-27 DIAGNOSIS — Z3480 Encounter for supervision of other normal pregnancy, unspecified trimester: Secondary | ICD-10-CM

## 2021-03-27 DIAGNOSIS — Z124 Encounter for screening for malignant neoplasm of cervix: Secondary | ICD-10-CM | POA: Insufficient documentation

## 2021-03-27 DIAGNOSIS — O209 Hemorrhage in early pregnancy, unspecified: Secondary | ICD-10-CM

## 2021-03-27 DIAGNOSIS — Z8279 Family history of other congenital malformations, deformations and chromosomal abnormalities: Secondary | ICD-10-CM | POA: Insufficient documentation

## 2021-03-27 MED ORDER — ASPIRIN EC 81 MG PO TBEC: 81.0000 mg | DELAYED_RELEASE_TABLET | Freq: Every day | ORAL | 11 refills | Status: DC

## 2021-03-27 NOTE — Progress Notes (Signed)
Subjective:   Nancy Peterson is a 40 y.o. G3P2002 at 100w1d by early ultrasound being seen today for her first obstetrical visit.  Her obstetrical history is significant for advanced maternal age. Patient does intend to breast feed. Pregnancy history fully reviewed.  Patient reports no complaints.  HISTORY: OB History  Gravida Para Term Preterm AB Living  3 2 2  0 0 2  SAB IAB Ectopic Multiple Live Births  0 0 0 0 2    # Outcome Date GA Lbr Len/2nd Weight Sex Delivery Anes PTL Lv  3 Current           2 Term 01/22/13 [redacted]w[redacted]d  7 lb 12 oz (3.515 kg) M Vag-Spont  N LIV  1 Term 06/23/09 [redacted]w[redacted]d  7 lb 8 oz (3.402 kg) M Vag-Spont EPI N LIV   Last pap smear was  several years ago and was normal Past Medical History:  Diagnosis Date   Chest pain    Medical history non-contributory    Vaginal Pap smear, abnormal 2014   Past Surgical History:  Procedure Laterality Date   KidneyStone Removal     Family History  Problem Relation Age of Onset   COPD Mother    COPD Father    Social History   Tobacco Use   Smoking status: Every Day    Packs/day: 0.25    Types: Cigarettes   Smokeless tobacco: Never  Vaping Use   Vaping Use: Never used  Substance Use Topics   Alcohol use: Not Currently   Drug use: Never   Allergies  Allergen Reactions   Penicillins Hives and Rash   Current Outpatient Medications on File Prior to Visit  Medication Sig Dispense Refill   Prenatal Vit-Fe Fumarate-FA (PRENATAL PLUS VITAMIN/MINERAL) 27-1 MG TABS Take 1 tablet by mouth daily at 6 (six) AM. 30 tablet 12   No current facility-administered medications on file prior to visit.     Exam   Vitals:   03/27/21 1503  BP: 117/74  Pulse: 78  Weight: 174 lb 12.8 oz (79.3 kg)   Fetal Heart Rate (bpm): 167  Uterus:     Pelvic Exam: Perineum: no hemorrhoids, normal perineum   Vulva: normal external genitalia, no lesions   Vagina:  normal mucosa, normal discharge   Cervix: no lesions and normal, pap  smear done.   System: General: well-developed, well-nourished female in no acute distress   Skin: normal coloration and turgor, no rashes   Neurologic: oriented, normal, negative, normal mood   Extremities: normal strength, tone, and muscle mass, ROM of all joints is normal   HEENT PERRLA, extraocular movement intact and sclera clear, anicteric   Neck supple and no masses   Respiratory:  no respiratory distress     Assessment:   Pregnancy: 03/29/21 Patient Active Problem List   Diagnosis Date Noted   AMA (advanced maternal age) multigravida 35+ 03/27/2021   Subchorionic hemorrhage in first trimester 03/22/2021   Supervision of other normal pregnancy, antepartum 02/27/2021   Educated about COVID-19 virus infection 01/13/2020   Tobacco dependence 08/15/2016     Plan:  1. Vaginal bleeding affecting early pregnancy   2. Supervision of other normal pregnancy, antepartum BP and FHR normal Considering BTL vs vasectomy She and partner are married but separated, working on their relationship Initial labs drawn. Continue prenatal vitamins. Genetic Screening discussed, NIPS: ordered. Ultrasound discussed; fetal anatomic survey: ordered. Problem list reviewed and updated. The nature of [facility]  with multiple MDs and other Advanced Practice Providers was explained to patient; also emphasized that residents, students are part of our team. - [place] MFM OB DETAIL +14 WK; Future  3. 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 Return in 4 weeks (on 04/24/2021) for [facility], ob visit.

## 2021-03-27 NOTE — Patient Instructions (Signed)
Second Trimester of Pregnancy °The second trimester of pregnancy is from week 13 through week 27. This is months 4 through 6 of pregnancy. The second trimester is often a time when you feel your best. Your body has adjusted to being pregnant, and you begin to feel better physically. °During the second trimester: °Morning sickness has lessened or stopped completely. °You may have more energy. °You may have an increase in appetite. °The second trimester is also a time when the unborn baby (fetus) is growing rapidly. At the end of the sixth month, the fetus may be up to 12 inches long and weigh about 1½ pounds. You will likely begin to feel the baby move (quickening) between 16 and 20 weeks of pregnancy. °Body changes during your second trimester °Your body continues to go through many changes during your second trimester. The changes vary and generally return to normal after the baby is born. °Physical changes °Your weight will continue to increase. You will notice your lower abdomen bulging out. °You may begin to get stretch marks on your hips, abdomen, and breasts. °Your breasts will continue to grow and to become tender. °Dark spots or blotches (chloasma or mask of pregnancy) may develop on your face. °A dark line from your belly button to the pubic area (linea nigra) may appear. °You may have changes in your hair. These can include thickening of your hair, rapid growth, and changes in texture. Some people also have hair loss during or after pregnancy, or hair that feels dry or thin. °Health changes °You may develop headaches. °You may have heartburn. °You may develop constipation. °You may develop hemorrhoids or swollen, bulging veins (varicose veins). °Your gums may bleed and may be sensitive to brushing and flossing. °You may urinate more often because the fetus is pressing on your bladder. °You may have back pain. This is caused by: °Weight gain. °Pregnancy hormones that are relaxing the joints in your  pelvis. °A shift in weight and the muscles that support your balance. °Follow these instructions at home: °Medicines °Follow your health care provider's instructions regarding medicine use. Specific medicines may be either safe or unsafe to take during pregnancy. Do not take any medicines unless approved by your health care provider. °Take a prenatal vitamin that contains at least 600 micrograms (mcg) of folic acid. °Eating and drinking °Eat a healthy diet that includes fresh fruits and vegetables, whole grains, good sources of protein such as meat, eggs, or tofu, and low-fat dairy products. °Avoid raw meat and unpasteurized juice, milk, and cheese. These carry germs that can harm you and your baby. °You may need to take these actions to prevent or treat constipation: °Drink enough fluid to keep your urine pale yellow. °Eat foods that are high in fiber, such as beans, whole grains, and fresh fruits and vegetables. °Limit foods that are high in fat and processed sugars, such as fried or sweet foods. °Activity °Exercise only as directed by your health care provider. Most people can continue their usual exercise routine during pregnancy. Try to exercise for 30 minutes at least 5 days a week. Stop exercising if you develop contractions in your uterus. °Stop exercising if you develop pain or cramping in the lower abdomen or lower back. °Avoid exercising if it is very hot or humid or if you are at a high altitude. °Avoid heavy lifting. °If you choose to, you may have sex unless your health care provider tells you not to. °Relieving pain and discomfort °Wear a supportive bra   to prevent discomfort from breast tenderness. °Take warm sitz baths to soothe any pain or discomfort caused by hemorrhoids. Use hemorrhoid cream if your health care provider approves. °Rest with your legs raised (elevated) if you have leg cramps or low back pain. °If you develop varicose veins: °Wear support hose as told by your health care  provider. °Elevate your feet for 15 minutes, 3-4 times a day. °Limit salt in your diet. °Safety °Wear your seat belt at all times when driving or riding in a car. °Talk with your health care provider if someone is verbally or physically abusive to you. °Lifestyle °Do not use hot tubs, steam rooms, or saunas. °Do not douche. Do not use tampons or scented sanitary pads. °Avoid cat litter boxes and soil used by cats. These carry germs that can cause birth defects in the baby and possibly loss of the fetus by miscarriage or stillbirth. °Do not use herbal remedies, alcohol, illegal drugs, or medicines that are not approved by your health care provider. Chemicals in these products can harm your baby. °Do not use any products that contain nicotine or tobacco, such as cigarettes, e-cigarettes, and chewing tobacco. If you need help quitting, ask your health care provider. °General instructions °During a routine prenatal visit, your health care provider will do a physical exam and other tests. He or she will also discuss your overall health. Keep all follow-up visits. This is important. °Ask your health care provider for a referral to a local prenatal education class. °Ask for help if you have counseling or nutritional needs during pregnancy. Your health care provider can offer advice or refer you to specialists for help with various needs. °Where to find more information °American Pregnancy Association: americanpregnancy.org °American College of Obstetricians and Gynecologists: acog.org/en/Womens%20Health/Pregnancy °Office on Women's Health: womenshealth.gov/pregnancy °Contact a health care provider if you have: °A headache that does not go away when you take medicine. °Vision changes or you see spots in front of your eyes. °Mild pelvic cramps, pelvic pressure, or nagging pain in the abdominal area. °Persistent nausea, vomiting, or diarrhea. °A bad-smelling vaginal discharge or foul-smelling urine. °Pain when you  urinate. °Sudden or extreme swelling of your face, hands, ankles, feet, or legs. °A fever. °Get help right away if you: °Have fluid leaking from your vagina. °Have spotting or bleeding from your vagina. °Have severe abdominal cramping or pain. °Have difficulty breathing. °Have chest pain. °Have fainting spells. °Have not felt your baby move for the time period told by your health care provider. °Have new or increased pain, swelling, or redness in an arm or leg. °Summary °The second trimester of pregnancy is from week 13 through week 27 (months 4 through 6). °Do not use herbal remedies, alcohol, illegal drugs, or medicines that are not approved by your health care provider. Chemicals in these products can harm your baby. °Exercise only as directed by your health care provider. Most people can continue their usual exercise routine during pregnancy. °Keep all follow-up visits. This is important. °This information is not intended to replace advice given to you by your health care provider. Make sure you discuss any questions you have with your health care provider. °Document Revised: 02/02/2020 Document Reviewed: 12/09/2019 °Elsevier Patient Education © 2022 Elsevier Inc. ° °Contraception Choices °Contraception, also called birth control, refers to methods or devices that prevent pregnancy. °Hormonal methods °Contraceptive implant °A contraceptive implant is a thin, plastic tube that contains a hormone that prevents pregnancy. It is different from an intrauterine device (IUD). It   is inserted into the upper part of the arm by a health care provider. Implants can be effective for up to 3 years. °Progestin-only injections °Progestin-only injections are injections of progestin, a synthetic form of the hormone progesterone. They are given every 3 months by a health care provider. °Birth control pills °Birth control pills are pills that contain hormones that prevent pregnancy. They must be taken once a day, preferably at the  same time each day. A prescription is needed to use this method of contraception. °Birth control patch °The birth control patch contains hormones that prevent pregnancy. It is placed on the skin and must be changed once a week for three weeks and removed on the fourth week. A prescription is needed to use this method of contraception. °Vaginal ring °A vaginal ring contains hormones that prevent pregnancy. It is placed in the vagina for three weeks and removed on the fourth week. After that, the process is repeated with a new ring. A prescription is needed to use this method of contraception. °Emergency contraceptive °Emergency contraceptives prevent pregnancy after unprotected sex. They come in pill form and can be taken up to 5 days after sex. They work best the sooner they are taken after having sex. Most emergency contraceptives are available without a prescription. This method should not be used as your only form of birth control. °Barrier methods °Female condom °A female condom is a thin sheath that is worn over the penis during sex. Condoms keep sperm from going inside a woman's body. They can be used with a sperm-killing substance (spermicide) to increase their effectiveness. They should be thrown away after one use. °Female condom °A female condom is a soft, loose-fitting sheath that is put into the vagina before sex. The condom keeps sperm from going inside a woman's body. They should be thrown away after one use. °Diaphragm °A diaphragm is a soft, dome-shaped barrier. It is inserted into the vagina before sex, along with a spermicide. The diaphragm blocks sperm from entering the uterus, and the spermicide kills sperm. A diaphragm should be left in the vagina for 6-8 hours after sex and removed within 24 hours. °A diaphragm is prescribed and fitted by a health care provider. A diaphragm should be replaced every 1-2 years, after giving birth, after gaining more than 15 lb (6.8 kg), and after pelvic  surgery. °Cervical cap °A cervical cap is a round, soft latex or plastic cup that fits over the cervix. It is inserted into the vagina before sex, along with spermicide. It blocks sperm from entering the uterus. The cap should be left in place for 6-8 hours after sex and removed within 48 hours. A cervical cap must be prescribed and fitted by a health care provider. It should be replaced every 2 years. °Sponge °A sponge is a soft, circular piece of polyurethane foam with spermicide in it. The sponge helps block sperm from entering the uterus, and the spermicide kills sperm. To use it, you make it wet and then insert it into the vagina. It should be inserted before sex, left in for at least 6 hours after sex, and removed and thrown away within 30 hours. °Spermicides °Spermicides are chemicals that kill or block sperm from entering the cervix and uterus. They can come as a cream, jelly, suppository, foam, or tablet. A spermicide should be inserted into the vagina with an applicator at least 10-15 minutes before sex to allow time for it to work. The process must be repeated every time   you have sex. Spermicides do not require a prescription. °Intrauterine contraception °Intrauterine device (IUD) °An IUD is a T-shaped device that is put in a woman's uterus. There are two types: °Hormone IUD.This type contains progestin, a synthetic form of the hormone progesterone. This type can stay in place for 3-5 years. °Copper IUD.This type is wrapped in copper wire. It can stay in place for 10 years. °Permanent methods of contraception °Female tubal ligation °In this method, a woman's fallopian tubes are sealed, tied, or blocked during surgery to prevent eggs from traveling to the uterus. °Hysteroscopic sterilization °In this method, a small, flexible insert is placed into each fallopian tube. The inserts cause scar tissue to form in the fallopian tubes and block them, so sperm cannot reach an egg. The procedure takes about 3  months to be effective. Another form of birth control must be used during those 3 months. °Female sterilization °This is a procedure to tie off the tubes that carry sperm (vasectomy). After the procedure, the man can still ejaculate fluid (semen). Another form of birth control must be used for 3 months after the procedure. °Natural planning methods °Natural family planning °In this method, a couple does not have sex on days when the woman could become pregnant. °Calendar method °In this method, the woman keeps track of the length of each menstrual cycle, identifies the days when pregnancy can happen, and does not have sex on those days. °Ovulation method °In this method, a couple avoids sex during ovulation. °Symptothermal method °This method involves not having sex during ovulation. The woman typically checks for ovulation by watching changes in her temperature and in the consistency of cervical mucus. °Post-ovulation method °In this method, a couple waits to have sex until after ovulation. °Where to find more information °Centers for Disease Control and Prevention: www.cdc.gov °Summary °Contraception, also called birth control, refers to methods or devices that prevent pregnancy. °Hormonal methods of contraception include implants, injections, pills, patches, vaginal rings, and emergency contraceptives. °Barrier methods of contraception can include female condoms, female condoms, diaphragms, cervical caps, sponges, and spermicides. °There are two types of IUDs (intrauterine devices). An IUD can be put in a woman's uterus to prevent pregnancy for 3-5 years. °Permanent sterilization can be done through a procedure for males and females. Natural family planning methods involve nothaving sex on days when the woman could become pregnant. °This information is not intended to replace advice given to you by your health care provider. Make sure you discuss any questions you have with your health care provider. °Document  Revised: 01/31/2020 Document Reviewed: 01/31/2020 °Elsevier Patient Education © 2022 Elsevier Inc. ° °

## 2021-03-28 LAB — CBC/D/PLT+RPR+RH+ABO+RUBIGG...
Antibody Screen: NEGATIVE
Basophils Absolute: 0.1 10*3/uL (ref 0.0–0.2)
Basos: 1 %
EOS (ABSOLUTE): 0.1 10*3/uL (ref 0.0–0.4)
Eos: 1 %
HCV Ab: 0.2 s/co ratio (ref 0.0–0.9)
HIV Screen 4th Generation wRfx: NONREACTIVE
Hematocrit: 40.6 % (ref 34.0–46.6)
Hemoglobin: 13.6 g/dL (ref 11.1–15.9)
Hepatitis B Surface Ag: NEGATIVE
Immature Grans (Abs): 0 10*3/uL (ref 0.0–0.1)
Immature Granulocytes: 0 %
Lymphocytes Absolute: 2.9 10*3/uL (ref 0.7–3.1)
Lymphs: 29 %
MCH: 31.4 pg (ref 26.6–33.0)
MCHC: 33.5 g/dL (ref 31.5–35.7)
MCV: 94 fL (ref 79–97)
Monocytes Absolute: 0.8 10*3/uL (ref 0.1–0.9)
Monocytes: 8 %
Neutrophils Absolute: 6.2 10*3/uL (ref 1.4–7.0)
Neutrophils: 61 %
Platelets: 322 10*3/uL (ref 150–450)
RBC: 4.33 x10E6/uL (ref 3.77–5.28)
RDW: 12.8 % (ref 11.7–15.4)
RPR Ser Ql: NONREACTIVE
Rh Factor: POSITIVE
Rubella Antibodies, IGG: 1.58 index (ref >0.99)
WBC: 10 10*3/uL (ref 3.4–10.8)

## 2021-03-28 LAB — HEMOGLOBIN A1C
Est. average glucose Bld gHb Est-mCnc: 108 mg/dL
Hgb A1c MFr Bld: 5.4 % (ref 4.8–5.6)

## 2021-03-28 LAB — CYTOLOGY - PAP
Chlamydia: NEGATIVE
Comment: NEGATIVE
Comment: NEGATIVE
Comment: NEGATIVE
Comment: NORMAL
Diagnosis: NEGATIVE
High risk HPV: NEGATIVE
Neisseria Gonorrhea: NEGATIVE
Trichomonas: NEGATIVE

## 2021-03-28 LAB — HCV INTERPRETATION

## 2021-03-29 LAB — URINE CULTURE, OB REFLEX

## 2021-03-29 LAB — CULTURE, OB URINE

## 2021-04-12 ENCOUNTER — Encounter: Payer: Self-pay | Admitting: *Deleted

## 2021-04-25 ENCOUNTER — Encounter: Payer: Self-pay | Admitting: General Practice

## 2021-04-30 ENCOUNTER — Ambulatory Visit (INDEPENDENT_AMBULATORY_CARE_PROVIDER_SITE_OTHER): Payer: Managed Care, Other (non HMO) | Admitting: Nurse Practitioner

## 2021-04-30 ENCOUNTER — Other Ambulatory Visit: Payer: Self-pay

## 2021-04-30 VITALS — BP 126/73 | HR 82 | Wt 183.2 lb

## 2021-04-30 DIAGNOSIS — Z3480 Encounter for supervision of other normal pregnancy, unspecified trimester: Secondary | ICD-10-CM

## 2021-04-30 DIAGNOSIS — Z8279 Family history of other congenital malformations, deformations and chromosomal abnormalities: Secondary | ICD-10-CM

## 2021-04-30 DIAGNOSIS — F17200 Nicotine dependence, unspecified, uncomplicated: Secondary | ICD-10-CM

## 2021-04-30 DIAGNOSIS — Z3A16 16 weeks gestation of pregnancy: Secondary | ICD-10-CM

## 2021-04-30 NOTE — Progress Notes (Signed)
    Subjective:  Nancy Peterson is a 40 y.o. G3P2002 at [redacted]w[redacted]d being seen today for ongoing prenatal care.  She is currently monitored for the following issues for this low-risk pregnancy and has Educated about COVID-19 virus infection; Supervision of other normal pregnancy, antepartum; Subchorionic hemorrhage in first trimester; Tobacco dependence; AMA (advanced maternal age) multigravida 35+; and Family history of tuberous sclerosis on their problem list.  Patient reports no complaints.  Contractions: Irritability. Vag. Bleeding: Small.  Movement: Present. Denies leaking of fluid.   The following portions of the patient's history were reviewed and updated as appropriate: allergies, current medications, past family history, past medical history, past social history, past surgical history and problem list. Problem list updated.  Objective:   Vitals:   04/30/21 1601  BP: 126/73  Pulse: 82  Weight: 183 lb 3.2 oz (83.1 kg)    Fetal Status: Fetal Heart Rate (bpm): 152   Movement: Present     General:  Alert, oriented and cooperative. Patient is in no acute distress.  Skin: Skin is warm and dry. No rash noted.   Cardiovascular: Normal heart rate noted  Respiratory: Normal respiratory effort, no problems with respiration noted  Abdomen: Soft, gravid, appropriate for gestational age. Pain/Pressure: Absent     Pelvic:  Cervical exam deferred        Extremities: Normal range of motion.  Edema: None  Mental Status: Normal mood and affect. Normal behavior. Normal judgment and thought content.   Urinalysis:      Assessment and Plan:  Pregnancy: G3P2002 at [redacted]w[redacted]d  1. Supervision of other normal pregnancy, antepartum Doing well.  Working 3rd shift right now - will go to days soon. Knows of [place] appt in September Wants to draw AFP at next visit - tired today Considering BTL - will not need to sign consent as has private insurance. Currently partner and she are separated - he may consider vasectomy.   Considering their options at this time.  2. Family history of tuberous sclerosis Is well informed and declines genetic counseling FOB and Other children have this condition  3. Tobacco dependence Smoking 5-7 cig per day Wants to quit but does not know if now is the time Advised to call [place] [facility] to discuss strategies for quitting Has tried once before cold [place] and then began smoking again. Is thinking she will try cold [place] once the baby is born  2. [redacted] weeks gestation of pregnancy   Preterm labor symptoms and general obstetric precautions including but not limited to vaginal bleeding, contractions, leaking of fluid and fetal movement were reviewed in detail with the patient. Please refer to After Visit Summary for other counseling recommendations.  Return in about 4 weeks (around 05/28/2021) for in person ROB.  Nolene Bernheim, RN, MSN, NP-BC Nurse Practitioner, [facility] for [employer], [facility] 04/30/2021 4:22 PM

## 2021-04-30 NOTE — Patient Instructions (Signed)
[phone] QUIT NOW   

## 2021-05-21 ENCOUNTER — Ambulatory Visit: Payer: Managed Care, Other (non HMO)

## 2021-05-25 ENCOUNTER — Ambulatory Visit: Payer: Managed Care, Other (non HMO) | Attending: Family Medicine | Admitting: *Deleted

## 2021-05-25 ENCOUNTER — Other Ambulatory Visit: Payer: Self-pay | Admitting: *Deleted

## 2021-05-25 ENCOUNTER — Ambulatory Visit (HOSPITAL_BASED_OUTPATIENT_CLINIC_OR_DEPARTMENT_OTHER): Payer: Managed Care, Other (non HMO)

## 2021-05-25 ENCOUNTER — Encounter: Payer: Self-pay | Admitting: *Deleted

## 2021-05-25 ENCOUNTER — Ambulatory Visit: Payer: Managed Care, Other (non HMO) | Attending: Maternal & Fetal Medicine | Admitting: Maternal & Fetal Medicine

## 2021-05-25 ENCOUNTER — Other Ambulatory Visit: Payer: Self-pay

## 2021-05-25 DIAGNOSIS — Z363 Encounter for antenatal screening for malformations: Secondary | ICD-10-CM | POA: Diagnosis not present

## 2021-05-25 DIAGNOSIS — O09522 Supervision of elderly multigravida, second trimester: Secondary | ICD-10-CM | POA: Insufficient documentation

## 2021-05-25 DIAGNOSIS — O352XX0 Maternal care for (suspected) hereditary disease in fetus, not applicable or unspecified: Secondary | ICD-10-CM

## 2021-05-25 DIAGNOSIS — Z3480 Encounter for supervision of other normal pregnancy, unspecified trimester: Secondary | ICD-10-CM | POA: Diagnosis not present

## 2021-05-25 DIAGNOSIS — O09529 Supervision of elderly multigravida, unspecified trimester: Secondary | ICD-10-CM

## 2021-05-25 DIAGNOSIS — Z3492 Encounter for supervision of normal pregnancy, unspecified, second trimester: Secondary | ICD-10-CM | POA: Diagnosis present

## 2021-05-25 DIAGNOSIS — Z3A19 19 weeks gestation of pregnancy: Secondary | ICD-10-CM | POA: Insufficient documentation

## 2021-05-25 DIAGNOSIS — O0992 Supervision of high risk pregnancy, unspecified, second trimester: Secondary | ICD-10-CM

## 2021-05-25 DIAGNOSIS — Z8279 Family history of other congenital malformations, deformations and chromosomal abnormalities: Secondary | ICD-10-CM

## 2021-05-25 DIAGNOSIS — Q851 Tuberous sclerosis: Secondary | ICD-10-CM

## 2021-05-25 NOTE — Progress Notes (Signed)
MFM Brief Note  Nancy Peterson is a 40 yo G3P2 who had two prior children with a post natal diagnosis of Tuberous Sclerosis (TSC).  Her prenatal care is uneventful. She had a low risk NIPS and neg horizon. She takes low dose ASA daily.  Single intrauterine pregnancy here for a detailed anatomy due to advance maternal age and family history of tuberous sclerosis Normal anatomy with measurements consistent with dates There is good fetal movement and amniotic fluid volume  [name] conveyed that her husband did not realize he had TSC. She understands that this is an autosomal dominant inheritance pattern.  She is aware of the increased risk of rhabdomyomas in the heart, brain, lungs and kidney, I explained that there was no evidence of a rhabdomyoma during today's exam.  In addition, she reports that this was not seen during her prior pregnancies either. One of her sons develeped a heart murmur vs arrhythmia and a mild seizure, this is what led to the diagnosis of TSC.  I discussed invasive testing and genetic counseling- she declined both as she and her husband are fully aware of the implications and risk and it would not change their management of the pregnancy.  Given the possible development of rhabdomyomas I have recommended serial growth every 4 weeks.   In addition, a fetal echocardiogram referral was sent to [facility] today.  All questions answered.  I spent 30 minutes with > 50% in face to face consultation.  Novella Olive, MD

## 2021-05-30 ENCOUNTER — Encounter: Payer: Managed Care, Other (non HMO) | Admitting: Family Medicine

## 2021-06-15 ENCOUNTER — Ambulatory Visit (INDEPENDENT_AMBULATORY_CARE_PROVIDER_SITE_OTHER): Payer: Managed Care, Other (non HMO) | Admitting: Family Medicine

## 2021-06-15 ENCOUNTER — Encounter: Payer: Self-pay | Admitting: Family Medicine

## 2021-06-15 ENCOUNTER — Other Ambulatory Visit: Payer: Self-pay

## 2021-06-15 ENCOUNTER — Encounter: Payer: Self-pay | Admitting: Radiology

## 2021-06-15 VITALS — BP 127/70 | HR 70 | Wt 184.5 lb

## 2021-06-15 DIAGNOSIS — O09522 Supervision of elderly multigravida, second trimester: Secondary | ICD-10-CM

## 2021-06-15 DIAGNOSIS — F17200 Nicotine dependence, unspecified, uncomplicated: Secondary | ICD-10-CM

## 2021-06-15 DIAGNOSIS — Z8279 Family history of other congenital malformations, deformations and chromosomal abnormalities: Secondary | ICD-10-CM

## 2021-06-15 DIAGNOSIS — Z3480 Encounter for supervision of other normal pregnancy, unspecified trimester: Secondary | ICD-10-CM

## 2021-06-15 NOTE — Progress Notes (Signed)
   Subjective:  Nancy Peterson is a 40 y.o. G3P2002 at [redacted]w[redacted]d being seen today for ongoing prenatal care.  She is currently monitored for the following issues for this high-risk pregnancy and has Educated about COVID-19 virus infection; Supervision of other normal pregnancy, antepartum; Subchorionic hemorrhage in first trimester; Tobacco dependence; AMA (advanced maternal age) multigravida 35+; and Family history of tuberous sclerosis on their problem list.  Patient reports no complaints.  Contractions: Not present. Vag. Bleeding: None.  Movement: Present. Denies leaking of fluid.   The following portions of the patient's history were reviewed and updated as appropriate: allergies, current medications, past family history, past medical history, past social history, past surgical history and problem list. Problem list updated.  Objective:   Vitals:   06/15/21 1040  BP: 127/70  Pulse: 70  Weight: 184 lb 8 oz (83.7 kg)    Fetal Status: Fetal Heart Rate (bpm): 154   Movement: Present     General:  Alert, oriented and cooperative. Patient is in no acute distress.  Skin: Skin is warm and dry. No rash noted.   Cardiovascular: Normal heart rate noted  Respiratory: Normal respiratory effort, no problems with respiration noted  Abdomen: Soft, gravid, appropriate for gestational age. Pain/Pressure: Absent     Pelvic: Vag. Bleeding: None     Cervical exam deferred        Extremities: Normal range of motion.  Edema: None  Mental Status: Normal mood and affect. Normal behavior. Normal judgment and thought content.   Urinalysis:      Assessment and Plan:  Pregnancy: G3P2002 at [redacted]w[redacted]d  1. Supervision of other normal pregnancy, antepartum BP and FHR normal  2. Tobacco dependence Still smoknig about 7 cig/day Recommended NRT as possible bridge to quitting, discussed association with NAS  3. Multigravida of advanced maternal age in second trimester   4. Family history of tuberous  sclerosis Prior extensive counseling with MFM Q4 week growth scans Fetal echo pending, per patient she plans to schedule after her next growth scan and peds cardiologist is ok with that  Preterm labor symptoms and general obstetric precautions including but not limited to vaginal bleeding, contractions, leaking of fluid and fetal movement were reviewed in detail with the patient. Please refer to After Visit Summary for other counseling recommendations.  Return in 4 weeks (on 07/13/2021) for [facility], ob visit.   Venora Maples, MD

## 2021-06-15 NOTE — Patient Instructions (Signed)

## 2021-06-18 ENCOUNTER — Telehealth: Payer: Self-pay

## 2021-06-18 NOTE — Telephone Encounter (Signed)
Patient is scheduled for an appointment at [facility] on 07/06/2021 at 1:45 with Dr. Elizebeth Brooking

## 2021-07-06 ENCOUNTER — Ambulatory Visit: Payer: Managed Care, Other (non HMO) | Attending: Maternal & Fetal Medicine

## 2021-07-06 ENCOUNTER — Encounter: Payer: Self-pay | Admitting: *Deleted

## 2021-07-06 ENCOUNTER — Ambulatory Visit: Payer: Managed Care, Other (non HMO) | Admitting: *Deleted

## 2021-07-06 ENCOUNTER — Other Ambulatory Visit: Payer: Self-pay | Admitting: *Deleted

## 2021-07-06 ENCOUNTER — Other Ambulatory Visit: Payer: Self-pay

## 2021-07-06 VITALS — BP 120/58 | HR 73

## 2021-07-06 DIAGNOSIS — Z8279 Family history of other congenital malformations, deformations and chromosomal abnormalities: Secondary | ICD-10-CM | POA: Insufficient documentation

## 2021-07-06 DIAGNOSIS — Z3A25 25 weeks gestation of pregnancy: Secondary | ICD-10-CM | POA: Diagnosis not present

## 2021-07-06 DIAGNOSIS — O09529 Supervision of elderly multigravida, unspecified trimester: Secondary | ICD-10-CM | POA: Diagnosis present

## 2021-07-06 DIAGNOSIS — O418X11 Other specified disorders of amniotic fluid and membranes, first trimester, fetus 1: Secondary | ICD-10-CM | POA: Insufficient documentation

## 2021-07-06 DIAGNOSIS — O352XX0 Maternal care for (suspected) hereditary disease in fetus, not applicable or unspecified: Secondary | ICD-10-CM

## 2021-07-06 DIAGNOSIS — Z3480 Encounter for supervision of other normal pregnancy, unspecified trimester: Secondary | ICD-10-CM

## 2021-07-06 DIAGNOSIS — O468X1 Other antepartum hemorrhage, first trimester: Secondary | ICD-10-CM | POA: Insufficient documentation

## 2021-07-06 DIAGNOSIS — Z87898 Personal history of other specified conditions: Secondary | ICD-10-CM

## 2021-07-06 DIAGNOSIS — O09522 Supervision of elderly multigravida, second trimester: Secondary | ICD-10-CM | POA: Diagnosis not present

## 2021-07-12 ENCOUNTER — Other Ambulatory Visit: Payer: Self-pay | Admitting: *Deleted

## 2021-07-12 DIAGNOSIS — Z3480 Encounter for supervision of other normal pregnancy, unspecified trimester: Secondary | ICD-10-CM

## 2021-07-13 ENCOUNTER — Other Ambulatory Visit: Payer: Managed Care, Other (non HMO)

## 2021-07-13 ENCOUNTER — Encounter: Payer: Managed Care, Other (non HMO) | Admitting: Obstetrics & Gynecology

## 2021-07-24 NOTE — Progress Notes (Signed)
   PRENATAL VISIT NOTE  Subjective:  Nancy Peterson is a 40 y.o. G3P2002 at [redacted]w[redacted]d being seen today for ongoing prenatal care.  She is currently monitored for the following issues for this low-risk pregnancy and has Educated about COVID-19 virus infection; Supervision of other normal pregnancy, antepartum; Subchorionic hemorrhage in first trimester; Tobacco dependence; AMA (advanced maternal age) multigravida 35+; and Family history of tuberous sclerosis on their problem list.  Patient reports no complaints.  Contractions: Not present.  .  Movement: Present. Denies leaking of fluid. Denies Vaginal bleeding.   The following portions of the patient's history were reviewed and updated as appropriate: allergies, current medications, past family history, past medical history, past social history, past surgical history and problem list.   Objective:   Vitals:   07/25/21 0835  BP: 137/68  Pulse: 77    Fetal Status: Fetal Heart Rate (bpm): 147 Fundal Height: 28 cm Movement: Present     General:  Alert, oriented and cooperative. Patient is in no acute distress.  Skin: Skin is warm and dry. No rash noted.   Cardiovascular: Normal heart rate noted  Respiratory: Normal respiratory effort, no problems with respiration noted  Abdomen: Soft, gravid, appropriate for gestational age.  Pain/Pressure: Absent     Pelvic: Cervical exam deferred        Extremities: Normal range of motion.  Edema: None  Mental Status: Normal mood and affect. Normal behavior. Normal judgment and thought content.   Assessment and Plan:  Pregnancy: G3P2002 at [redacted]w[redacted]d 1. Multigravida of advanced maternal age in third trimester - NIPS wnl  2. Tobacco dependence - Counseled on cessation. Discussed the risks of tobacco in pregnancy.  - Smokes about 5 cpd. She smokes for stress relief and spouse smokes too. She will also try to have him quit smoking. Reviewed having him see PCP as insurance often times now covers nicotine  replacement.  - Offered nicotine replacement - I think a patch would be too much. Pt would like to try the gum.   3. Supervision of other normal pregnancy, antepartum - 28 wks labs today - Recommended tdap - pt got it today - Reviewed birth control - pt desires permanent sterilization - may do BTS postpartum or L/S interval salpingectomy. May also consider vasectomy. She will let us know preference at upcoming appointments. She and spouse are on different insurance.  - She would like to nurse - she has not been successful previously but feels like there is more support now. Will have her see Edd Arbour next time for more information. Pt very appreciative.   4. FOB with tuberous sclerosis - Serial growth [place] - last growth on 10/28 was 62%ile - Fetal Echo - w[facility] on 10/28 with [facility]  Preterm labor symptoms and general obstetric precautions including but not limited to vaginal bleeding, contractions, leaking of fluid and fetal movement were reviewed in detail with the patient. Please refer to After Visit Summary for other counseling recommendations.   Return in about 2 weeks (around 08/08/2021) for OB VISIT, MD or APP - schedule next appt with Jamilla please.  Future Appointments  Date Time Provider Department Center  08/10/2021  8:30 AM [facility] NURSE [facility]  08/10/2021  8:45 AM [facility] US4 [facility]    Milas Hock, MD

## 2021-07-25 ENCOUNTER — Other Ambulatory Visit: Payer: Self-pay

## 2021-07-25 ENCOUNTER — Encounter: Payer: Self-pay | Admitting: Obstetrics and Gynecology

## 2021-07-25 ENCOUNTER — Ambulatory Visit (INDEPENDENT_AMBULATORY_CARE_PROVIDER_SITE_OTHER): Payer: Managed Care, Other (non HMO) | Admitting: Obstetrics and Gynecology

## 2021-07-25 ENCOUNTER — Other Ambulatory Visit: Payer: Managed Care, Other (non HMO)

## 2021-07-25 VITALS — BP 137/68 | HR 77

## 2021-07-25 DIAGNOSIS — Z3480 Encounter for supervision of other normal pregnancy, unspecified trimester: Secondary | ICD-10-CM

## 2021-07-25 DIAGNOSIS — O468X1 Other antepartum hemorrhage, first trimester: Secondary | ICD-10-CM

## 2021-07-25 DIAGNOSIS — Z23 Encounter for immunization: Secondary | ICD-10-CM

## 2021-07-25 DIAGNOSIS — O09523 Supervision of elderly multigravida, third trimester: Secondary | ICD-10-CM

## 2021-07-25 DIAGNOSIS — Z8279 Family history of other congenital malformations, deformations and chromosomal abnormalities: Secondary | ICD-10-CM

## 2021-07-25 DIAGNOSIS — O418X11 Other specified disorders of amniotic fluid and membranes, first trimester, fetus 1: Secondary | ICD-10-CM

## 2021-07-25 DIAGNOSIS — F17200 Nicotine dependence, unspecified, uncomplicated: Secondary | ICD-10-CM

## 2021-07-25 MED ORDER — NICOTINE POLACRILEX 2 MG MT GUM: 2.0000 mg | CHEWING_GUM | OROMUCOSAL | 1 refills | Status: DC | PRN

## 2021-07-25 NOTE — Addendum Note (Signed)
Addended by: Guy Begin on: 07/25/2021 09:14 AM   Modules accepted: Orders

## 2021-07-26 ENCOUNTER — Telehealth: Payer: Self-pay | Admitting: Lactation Services

## 2021-07-26 ENCOUNTER — Encounter: Payer: Self-pay | Admitting: Obstetrics and Gynecology

## 2021-07-26 LAB — CBC
Hematocrit: 35.1 % (ref 34.0–46.6)
Hemoglobin: 12.2 g/dL (ref 11.1–15.9)
MCH: 32.6 pg (ref 26.6–33.0)
MCHC: 34.8 g/dL (ref 31.5–35.7)
MCV: 94 fL (ref 79–97)
Platelets: 306 10*3/uL (ref 150–450)
RBC: 3.74 x10E6/uL — ABNORMAL LOW (ref 3.77–5.28)
RDW: 12.5 % (ref 11.7–15.4)
WBC: 13.4 10*3/uL — ABNORMAL HIGH (ref 3.4–10.8)

## 2021-07-26 LAB — GLUCOSE TOLERANCE, 2 HOURS W/ 1HR
Glucose, 1 hour: 187 mg/dL — ABNORMAL HIGH (ref 70–179)
Glucose, 2 hour: 118 mg/dL (ref 70–152)
Glucose, Fasting: 79 mg/dL (ref 70–91)

## 2021-07-26 LAB — RPR: RPR Ser Ql: NONREACTIVE

## 2021-07-26 LAB — HIV ANTIBODY (ROUTINE TESTING W REFLEX): HIV Screen 4th Generation wRfx: NONREACTIVE

## 2021-07-26 NOTE — Telephone Encounter (Signed)
Called patient in response to her My Chart message about her Lab results. Patient did not answer. LM for patient to check her My Chart Message.

## 2021-07-31 ENCOUNTER — Encounter: Payer: Managed Care, Other (non HMO) | Attending: Obstetrics and Gynecology | Admitting: Registered"

## 2021-07-31 ENCOUNTER — Other Ambulatory Visit: Payer: Self-pay

## 2021-07-31 ENCOUNTER — Ambulatory Visit: Payer: Managed Care, Other (non HMO) | Admitting: Registered"

## 2021-07-31 DIAGNOSIS — O24419 Gestational diabetes mellitus in pregnancy, unspecified control: Secondary | ICD-10-CM | POA: Diagnosis not present

## 2021-07-31 MED ORDER — ACCU-CHEK SOFTCLIX LANCETS MISC: 12 refills | Status: DC

## 2021-07-31 MED ORDER — GLUCOSE BLOOD VI STRP: ORAL_STRIP | 12 refills | Status: DC

## 2021-07-31 NOTE — Progress Notes (Signed)
Patient was seen for Gestational Diabetes self-management on 07/31/21  Start time 1520 and End time 1615   Estimated due date: 10/15/21; [redacted]w[redacted]d  Clinical: Medications: aspirin (04/06/21), prenatal Medical History: reviewed Labs: OGTT 79-187(h)-118, A1c 5.4%   Dietary and Lifestyle History: Patient had questions about the test and if black coffee could have affected the results.  Physical Activity: not assessed Stress: not assessed Sleep: not assessed  24 hr Recall:  First Meal: eggs, fruit OR meal replacement drink Snack: cheese & crackers Second meal: beef stew Snack: fruits/vegetables Third meal: peanut butter sandwich Snack: Beverages: water, 1/2 can zero sugar sprite  NUTRITION INTERVENTION  Nutrition education (E-1) on the following topics:   Initial Follow-up  [x]  []  Definition of Gestational Diabetes [x]  []  Why dietary management is important in controlling blood glucose [x]  []  Effects each nutrient has on blood glucose levels [x]  []  Simple carbohydrates vs complex carbohydrates [x]  []  Fluid intake [x]  []  Creating a balanced meal plan [x]  []  Carbohydrate counting  [x]  []  When to check blood glucose levels [x]  []  Proper blood glucose monitoring techniques [x]  []  Effect of stress and stress reduction techniques  [x]  []  Exercise effect on blood glucose levels, appropriate exercise during pregnancy [x]  []  Importance of limiting caffeine and abstaining from alcohol and smoking [x]  []  Medications used for blood sugar control during pregnancy [x]  []  Hypoglycemia and rule of 15 [x]  []  Postpartum self care  Blood glucose monitor given: Accu-chek Guide Me Lot Exp: 09/20/2022 CBG: 108 mg/dL   Patient instructed to monitor glucose levels: FBS: 60 - ? 95 mg/dL (some clinics use 90 for cutoff) 1 hour: ? 140 mg/dL 2 hour: ? mg/dL  Patient received handouts: Nutrition Diabetes and Pregnancy Carbohydrate Counting List  Patient will be seen for follow-up as  needed.

## 2021-08-07 ENCOUNTER — Other Ambulatory Visit: Payer: Self-pay

## 2021-08-08 ENCOUNTER — Encounter: Payer: Self-pay | Admitting: Obstetrics and Gynecology

## 2021-08-10 ENCOUNTER — Telehealth: Payer: Self-pay | Admitting: Lactation Services

## 2021-08-10 ENCOUNTER — Other Ambulatory Visit: Payer: Self-pay | Admitting: *Deleted

## 2021-08-10 ENCOUNTER — Other Ambulatory Visit: Payer: Self-pay

## 2021-08-10 ENCOUNTER — Ambulatory Visit: Payer: Managed Care, Other (non HMO) | Attending: Maternal & Fetal Medicine

## 2021-08-10 ENCOUNTER — Encounter: Payer: Self-pay | Admitting: *Deleted

## 2021-08-10 ENCOUNTER — Encounter: Payer: Self-pay | Admitting: Advanced Practice Midwife

## 2021-08-10 ENCOUNTER — Ambulatory Visit: Payer: Managed Care, Other (non HMO) | Admitting: *Deleted

## 2021-08-10 ENCOUNTER — Ambulatory Visit (INDEPENDENT_AMBULATORY_CARE_PROVIDER_SITE_OTHER): Payer: Managed Care, Other (non HMO) | Admitting: Advanced Practice Midwife

## 2021-08-10 VITALS — BP 123/68 | HR 83

## 2021-08-10 VITALS — BP 126/44 | HR 75 | Wt 185.0 lb

## 2021-08-10 DIAGNOSIS — O24414 Gestational diabetes mellitus in pregnancy, insulin controlled: Secondary | ICD-10-CM | POA: Diagnosis not present

## 2021-08-10 DIAGNOSIS — Z8489 Family history of other specified conditions: Secondary | ICD-10-CM | POA: Insufficient documentation

## 2021-08-10 DIAGNOSIS — Z3480 Encounter for supervision of other normal pregnancy, unspecified trimester: Secondary | ICD-10-CM

## 2021-08-10 DIAGNOSIS — O418X11 Other specified disorders of amniotic fluid and membranes, first trimester, fetus 1: Secondary | ICD-10-CM

## 2021-08-10 DIAGNOSIS — O24419 Gestational diabetes mellitus in pregnancy, unspecified control: Secondary | ICD-10-CM

## 2021-08-10 DIAGNOSIS — Z3A30 30 weeks gestation of pregnancy: Secondary | ICD-10-CM | POA: Diagnosis not present

## 2021-08-10 DIAGNOSIS — Z87898 Personal history of other specified conditions: Secondary | ICD-10-CM | POA: Diagnosis present

## 2021-08-10 DIAGNOSIS — Z362 Encounter for other antenatal screening follow-up: Secondary | ICD-10-CM | POA: Insufficient documentation

## 2021-08-10 DIAGNOSIS — O09523 Supervision of elderly multigravida, third trimester: Secondary | ICD-10-CM

## 2021-08-10 DIAGNOSIS — O24410 Gestational diabetes mellitus in pregnancy, diet controlled: Secondary | ICD-10-CM

## 2021-08-10 DIAGNOSIS — O468X1 Other antepartum hemorrhage, first trimester: Secondary | ICD-10-CM | POA: Insufficient documentation

## 2021-08-10 NOTE — Addendum Note (Signed)
Addended by: Guy Begin on: 08/10/2021 10:55 AM   Modules accepted: Orders

## 2021-08-10 NOTE — Progress Notes (Signed)
   PRENATAL VISIT NOTE  Subjective:  Nancy Peterson is a 40 y.o. G3P2002 at [redacted]w[redacted]d being seen today for ongoing prenatal care.  She is currently monitored for the following issues for this high-risk pregnancy and has Educated about COVID-19 virus infection; Supervision of other normal pregnancy, antepartum; Subchorionic hemorrhage in first trimester; Tobacco dependence; AMA (advanced maternal age) multigravida 35+; Family history of tuberous sclerosis; and GDM (gestational diabetes mellitus) on their problem list.  Patient reports no complaints.  Contractions: Irritability. Vag. Bleeding: None.  Movement: Present. Denies leaking of fluid.   The following portions of the patient's history were reviewed and updated as appropriate: allergies, current medications, past family history, past medical history, past social history, past surgical history and problem list.   Objective:   Vitals:   08/10/21 1008  BP: (!) 126/44  Pulse: 75  Weight: 83.9 kg    Fetal Status: Fetal Heart Rate (bpm): 140 Fundal Height: 31 cm Movement: Present     General:  Alert, oriented and cooperative. Patient is in no acute distress.  Skin: Skin is warm and dry. No rash noted.   Cardiovascular: Normal heart rate noted  Respiratory: Normal respiratory effort, no problems with respiration noted  Abdomen: Soft, gravid, appropriate for gestational age.  Pain/Pressure: Absent     Pelvic: Cervical exam deferred        Extremities: Normal range of motion.  Edema: None  Mental Status: Normal mood and affect. Normal behavior. Normal judgment and thought content.   Assessment and Plan:  Pregnancy: G3P2002 at [redacted]w[redacted]d 1. Supervision of other normal pregnancy, antepartum   2. [redacted] weeks gestation of pregnancy   3. Diet controlled gestational diabetes mellitus (GDM) in third trimester Glucose log: Fasting: 86-112 ( two out of 2 are elevated) PP: 90-123 (one out of 26 is elevated)  Preterm labor symptoms and general  obstetric precautions including but not limited to vaginal bleeding, contractions, leaking of fluid and fetal movement were reviewed in detail with the patient. Please refer to After Visit Summary for other counseling recommendations.   Return in about 2 weeks (around 08/24/2021).  Future Appointments  Date Time Provider Department Center  08/24/2021 10:15 AM [facility]  09/07/2021  8:30 AM [facility] NURSE [facility]  09/07/2021  8:45 AM [facility] US4 [facility]    Thressa Sheller DNP, CNM  08/10/21  10:44 AM

## 2021-08-10 NOTE — Telephone Encounter (Signed)
Was asked by [facility] staff to call mom in regards to questions about breast feeding. Patient did not answer, LM for her to call the office with further questions or concerns as needed. Reviewed I would send a My Chart message if she gets a chance to allow me to answer her questions.

## 2021-08-16 ENCOUNTER — Encounter: Payer: Self-pay | Admitting: Lactation Services

## 2021-08-16 ENCOUNTER — Telehealth: Payer: Self-pay | Admitting: Lactation Services

## 2021-08-16 NOTE — Telephone Encounter (Signed)
Called patient to discuss patient concerns about breast feeding. She did not answer. LM for her to call the office at her convenience to let me know if she would like to keep her appt with Lactation on 12/20.

## 2021-08-24 ENCOUNTER — Telehealth (INDEPENDENT_AMBULATORY_CARE_PROVIDER_SITE_OTHER): Payer: Managed Care, Other (non HMO) | Admitting: Certified Nurse Midwife

## 2021-08-24 DIAGNOSIS — Z3403 Encounter for supervision of normal first pregnancy, third trimester: Secondary | ICD-10-CM

## 2021-08-24 NOTE — Progress Notes (Signed)
Attempted to call patient at approximately 10:20 AM in regards to her 10:15 AM virtual appointment. Patient did not answer. A second call was placed to patient at approximately 10:45 AM and a voicemail was left asking patient to login to her mychart for her appointment or give the office a call back.   Alesia Richards, RN 08/24/21

## 2021-08-28 ENCOUNTER — Other Ambulatory Visit: Payer: Self-pay

## 2021-08-28 ENCOUNTER — Ambulatory Visit (INDEPENDENT_AMBULATORY_CARE_PROVIDER_SITE_OTHER): Payer: Managed Care, Other (non HMO) | Admitting: Lactation Services

## 2021-08-28 DIAGNOSIS — Z7189 Other specified counseling: Secondary | ICD-10-CM

## 2021-08-28 NOTE — Progress Notes (Signed)
Mom was not able to BF her first 2 children. She is not sure how well she was able to make milk for her first 2 infants. She only tried for a few days with each older child. She reports extreme engorgement and pain with latching. She does not wish to latch infant to the breast.   She has received a Spectra S2 pump. She reports that the pump hurt a lot and that deterred her from pumping. Reviewed pumping and flange size fitting.   Mom is a [facility] client. She is concerned with milk production and having enough for infant. She is able to see colostrum with nipple compression now.    Patient reports she may not have a place to pump at work. 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 Reviewed per state law, they must provide a clean, non bathroom place to pump.   Reviewed supply and demand and importance of frequently emptying the breast to maintain and promote milk supply. Reviewed pumping about 8 times a day if exclusively pumping.   Reviewed normal feeding patterns of newborn infants. Reviewed normalcy of milk coming to volume. Reviewed lactation consultants in the hospital and OP Services. Mom has been informed of breast feeding support groups. Reviewed paced bottle feeding and nipple flow. She has Dr. Theora Gianotti Level 1 nipple. Reviewed hand expression and pumping. Reviewed using a hands free bra for pumping.   Reviewed prenatal hand expression post 37 weeks. She is aware can freeze and take to hospital with her if she has it.   Reviewed breast milk storage at room temperature, refrigerator and freezer.

## 2021-09-07 ENCOUNTER — Other Ambulatory Visit: Payer: Self-pay

## 2021-09-07 ENCOUNTER — Ambulatory Visit: Payer: Managed Care, Other (non HMO) | Attending: Obstetrics and Gynecology

## 2021-09-07 ENCOUNTER — Other Ambulatory Visit: Payer: Self-pay | Admitting: *Deleted

## 2021-09-07 ENCOUNTER — Ambulatory Visit: Payer: Managed Care, Other (non HMO) | Admitting: *Deleted

## 2021-09-07 VITALS — BP 122/65 | HR 89

## 2021-09-07 DIAGNOSIS — O24410 Gestational diabetes mellitus in pregnancy, diet controlled: Secondary | ICD-10-CM | POA: Insufficient documentation

## 2021-09-07 DIAGNOSIS — Z3480 Encounter for supervision of other normal pregnancy, unspecified trimester: Secondary | ICD-10-CM | POA: Diagnosis present

## 2021-09-07 DIAGNOSIS — Z8279 Family history of other congenital malformations, deformations and chromosomal abnormalities: Secondary | ICD-10-CM

## 2021-09-07 DIAGNOSIS — O468X1 Other antepartum hemorrhage, first trimester: Secondary | ICD-10-CM | POA: Diagnosis present

## 2021-09-07 DIAGNOSIS — O24419 Gestational diabetes mellitus in pregnancy, unspecified control: Secondary | ICD-10-CM | POA: Diagnosis present

## 2021-09-07 DIAGNOSIS — Z8489 Family history of other specified conditions: Secondary | ICD-10-CM | POA: Insufficient documentation

## 2021-09-07 DIAGNOSIS — O418X11 Other specified disorders of amniotic fluid and membranes, first trimester, fetus 1: Secondary | ICD-10-CM | POA: Diagnosis present

## 2021-09-07 DIAGNOSIS — O09523 Supervision of elderly multigravida, third trimester: Secondary | ICD-10-CM | POA: Diagnosis present

## 2021-09-07 DIAGNOSIS — Z3A34 34 weeks gestation of pregnancy: Secondary | ICD-10-CM | POA: Diagnosis not present

## 2021-09-09 NOTE — L&D Delivery Note (Signed)
Delivery Note FHR remained Cat 1, so Pitocin was resumed.  After several hours, ctx became adequate, labor progressed, and FHR remained Cat 1.  Pt labored down for an hour or so until urge to push was felt.  After a 3 ctx 2nd stage, at 10:38 PM a viable female was delivered via Vaginal, Spontaneous (Presentation: Right Occiput Anterior).  APGAR: 9, 9; weight pending. After 1 minute, the cord was clamped and cut. 40 units of pitocin diluted in 1000cc LR was infused rapidly IV.  The placenta separated spontaneously and delivered via CCT and maternal pushing effort.  It was inspected and appears to be intact with a 3 VC.   Anesthesia: Epidural Episiotomy: None Lacerations: None Suture Repair:  Est. Blood Loss (mL): 275  Mom to postpartum.  Baby to Couplet care / Skin to Skin.  Christin Fudge 10/11/2021, 10:54 PM

## 2021-09-17 ENCOUNTER — Ambulatory Visit (INDEPENDENT_AMBULATORY_CARE_PROVIDER_SITE_OTHER): Payer: Managed Care, Other (non HMO) | Admitting: Family Medicine

## 2021-09-17 ENCOUNTER — Other Ambulatory Visit: Payer: Self-pay

## 2021-09-17 ENCOUNTER — Other Ambulatory Visit (HOSPITAL_COMMUNITY)
Admission: RE | Admit: 2021-09-17 | Discharge: 2021-09-17 | Disposition: A | Discharge status: Home / Self Care | Payer: Managed Care, Other (non HMO) | Source: Ambulatory Visit | Attending: Family Medicine | Admitting: Family Medicine

## 2021-09-17 VITALS — BP 125/58 | HR 73 | Wt 189.0 lb

## 2021-09-17 DIAGNOSIS — Z3A36 36 weeks gestation of pregnancy: Secondary | ICD-10-CM

## 2021-09-17 DIAGNOSIS — O24410 Gestational diabetes mellitus in pregnancy, diet controlled: Secondary | ICD-10-CM | POA: Diagnosis not present

## 2021-09-17 DIAGNOSIS — O0993 Supervision of high risk pregnancy, unspecified, third trimester: Secondary | ICD-10-CM | POA: Diagnosis present

## 2021-09-17 LAB — OB RESULTS CONSOLE GC/CHLAMYDIA: Gonorrhea: NEGATIVE

## 2021-09-17 NOTE — Progress Notes (Signed)
° °  Subjective:  Nancy Peterson is a 41 y.o. G3P2002 at [redacted]w[redacted]d being seen today for ongoing prenatal care.  She is currently monitored for the following issues for this high-risk pregnancy and has Educated about COVID-19 virus infection; Supervision of other normal pregnancy, antepartum; Subchorionic hemorrhage in first trimester; Tobacco dependence; AMA (advanced maternal age) multigravida 59+; Family history of tuberous sclerosis; and GDM (gestational diabetes mellitus) on their problem list.  Patient reports no complaints.  Contractions: Not present. Vag. Bleeding: None.  Movement: Present. Denies leaking of fluid.   The following portions of the patient's history were reviewed and updated as appropriate: allergies, current medications, past family history, past medical history, past social history, past surgical history and problem list. Problem list updated.  Objective:   Vitals:   09/17/21 1614  BP: (!) 125/58  Pulse: 73  Weight: 189 lb (85.7 kg)    Fetal Status: Fetal Heart Rate (bpm): 136   Movement: Present     General:  Alert, oriented and cooperative. Patient is in no acute distress.  Skin: Skin is warm and dry. No rash noted.   Cardiovascular: Normal heart rate noted  Respiratory: Normal respiratory effort, no problems with respiration noted  Abdomen: Soft, gravid, appropriate for gestational age. Pain/Pressure: Absent     Pelvic: Vag. Bleeding: None     Cervical exam performed      closed/thick/-2  Extremities: Normal range of motion.     Mental Status: Normal mood and affect. Normal behavior. Normal judgment and thought content.    Assessment and Plan:  Pregnancy: G3P2002 at [redacted]w[redacted]d Supervision of high risk pregnancy [redacted] weeks gestation of pregnancy Doing well. No acute concerns . 36 week swabs done today. Opted to get cervix checked, closed/thick/-2 - follow up in 1 week  3. A1GDM Diet controlled. Did not bring BG log with her today. But reports fasting BG 80s, 2 hr post  prandial 100-120. Had growth [place] on 12/30, EFW 59%ile. Has repeat growth at on 1/27 per MFM report.  - continue diet control - Discussed that if not gone into labor on her own by 39 weeks we recommend IOL between 39-40 weeks. Patient would like to wait to schedule until next visit after she speaks with her husband.  - blood glucose log given to patient, recommended bringing it in next time  There are no diagnoses linked to this encounter. Preterm labor symptoms and general obstetric precautions including but not limited to vaginal bleeding, contractions, leaking of fluid and fetal movement were reviewed in detail with the patient. Please refer to After Visit Summary for other counseling recommendations.  Return in about 1 week (around 09/24/2021) for HROB, any MD/DO.   Renard Matter, MD, MPH OB Fellow, Faculty Practice

## 2021-09-17 NOTE — Patient Instructions (Signed)
The [employer]  This circuit takes at least 90 minutes to complete so clear your schedule and make mental preparations so you can relax in your environment. The second step requires a lot of pillows so gather them up before beginning Before starting, you should empty your bladder! Have a nice drink nearby, and make sure it has a straw! If you are having contractions, this circuit should be done through contractions, try not to change positions between steps Before you begin...  "I named this 'circuit' after my friend Nancy Peterson, who shared and discussed it with me when I was working with a client whose labor seemed to be stalled out and no longer progressing... This circuit is useful to help get the baby lined up, ideally, in the "Left Occiput Anterior" (LOA) Position, both before labor begins and when some corrections need to be done during labor. Prenatally, this position set can help to rotate a baby. As a natural method of induction, this can help get things going if baby just needed a gentle nudge of position to set things off. To the best of my knowledge, this group of positions will not "hurt" a baby that is already lined up correctly." - Nancy Peterson   Step One: Open-knee Chest Stay in this position for 30 minutes, start in cat/cow, then drop your chest as low as you can to the bed or the floor and your bottom as high as you can. Knees should be fairly wide apart, and the angle between the torso/thighs should be wider than 90 degrees. Wiggle around, prop with lots of pillows and use this time to get totally relaxed. This position allows the baby to scoot out of the pelvis a bit and gives them room to rotate, shift their head position, etc. If the pregnant person finds it helpful, careful positioning with a rebozo under the belly, with gentle tension from a support person behind can help maintain this position for the full 30 minutes.  Step Two:Exaggerated Left Side  Lying Roll to your left side, bringing your top leg as high as possible and keeping your bottom leg straight. Roll forward as much as possible, again using a lot of pillows. Sink into the bed and relax some more. If you fall asleep, that's totally okay and you can stay there! If not, stay here for at least another half an hour. Try and get your top right leg up towards your head and get as rolled over onto your belly as much as possible. If you repeat the circuit during labor, try alternating left and right sides. We know the photo the left is actually right side... just flip the image in your head.  Step Three: Moving and Lunges Lunge, walk stairs facing sideways, 2 at a time, (have a spotter downstairs of you!), take a walk outside with one foot on the curb and the other on the street, sit on a birth ball and hula- anything that's upright and putting your pelvis in open, asymmetrical positions. Spend at least 30 minutes doing this one as well to give your baby a chance to move down. If you are lunging or stair or curb walking, you should lunge/walk/go up stairs in the direction that feels better to you. The key with the lunge is that the toes of the higher leg and mom's belly button should be at right angles. Do not lunge over your knee, that closes the pelvis.     Nancy Peterson: Circuit Creator - [email or web address] Nancy   Peterson, CD, BDT (DONA), LCCE, FACCE: Supporting Content - [email or web address] Nancy Peterson: Photography - [email or web address] Kate Dewey CD/CDT (BAI): Print and Webmaster - [email or web address] [employer] [email or web address]  

## 2021-09-18 LAB — CERVICOVAGINAL ANCILLARY ONLY
Chlamydia: NEGATIVE
Comment: NEGATIVE
Comment: NORMAL
Neisseria Gonorrhea: NEGATIVE

## 2021-09-21 LAB — CULTURE, BETA STREP (GROUP B ONLY): Strep Gp B Culture: NEGATIVE

## 2021-09-24 ENCOUNTER — Encounter: Payer: Self-pay | Admitting: Obstetrics and Gynecology

## 2021-09-24 NOTE — Progress Notes (Signed)
° °  PRENATAL VISIT NOTE  Subjective:  Nancy Peterson is a 41 y.o. G3P2002 at Unknown being seen today for ongoing prenatal care.  She is currently monitored for the following issues for this high-risk pregnancy and has Educated about COVID-19 virus infection; Supervision of other normal pregnancy, antepartum; Tobacco dependence; AMA (advanced maternal age) multigravida 35+; Family history of tuberous sclerosis; and GDM (gestational diabetes mellitus) on their problem list.  Patient reports no complaints.  Contractions: Irritability. Vag. Bleeding: None.  Movement: Present. Denies leaking of fluid.   The following portions of the patient's history were reviewed and updated as appropriate: allergies, current medications, past family history, past medical history, past social history, past surgical history and problem list.   Objective:   Vitals:   09/25/21 1627  BP: (!) 119/52  Pulse: 62  Weight: 186 lb 9.6 oz (84.6 kg)    Fetal Status: Fetal Heart Rate (bpm): 135   Movement: Present     General:  Alert, oriented and cooperative. Patient is in no acute distress.  Skin: Skin is warm and dry. No rash noted.   Cardiovascular: Normal heart rate noted  Respiratory: Normal respiratory effort, no problems with respiration noted  Abdomen: Soft, gravid, appropriate for gestational age.  Pain/Pressure: Present     Pelvic: Cervical exam deferred        Extremities: Normal range of motion.  Edema: None  Mental Status: Normal mood and affect. Normal behavior. Normal judgment and thought content.   Assessment and Plan:  Pregnancy: G3P2002 at Unknown 1. Gestational diabetes mellitus (GDM) in third trimester, gestational diabetes method of control unspecified - CBGs reviewed: Fastings and meals all normal.  - Had growth [place] on 12/30, EFW 59%ile. Has repeat growth on 1/24  - After discussing risks/benefits of continued pregnancy vs induction, pt agreeable to IOL in 39th week. She would like 2/2.  Induction of labor 2/2 for morning set up  2. Family history of tuberous sclerosis FOB history Serial growth normal - see above Normal fetal echo on 10/28  3. Multigravida of advanced maternal age in third trimester LR NIPS  4. Tobacco dependence - She has been trying the 2 mg gum and feels like higher dose may help. Will send in 4 mg.   5. Supervision of other normal pregnancy, antepartum GBS Neg Discussed birth control options again: spouse will be getting a vasectomy. She is considering options for bridging to that. Discussed ppIUD and condoms (she was interested in non-hormonal options).   Term labor symptoms and general obstetric precautions including but not limited to vaginal bleeding, contractions, leaking of fluid and fetal movement were reviewed in detail with the patient. Please refer to After Visit Summary for other counseling recommendations.   Return in about 1 week (around 10/02/2021) for OB VISIT, MD or APP.  Future Appointments  Date Time Provider [place]  10/01/2021 10:35 AM Osborne Oman, MD [facility]  10/02/2021 11:00 AM [facility] NURSE [facility]  10/02/2021 11:15 AM [facility] US2 [facility]  10/11/2021  7:00 AM [facility]-LD SCHED ROOM [facility]IN[facility] None    Radene Gunning, MD

## 2021-09-25 ENCOUNTER — Encounter: Payer: Self-pay | Admitting: Obstetrics and Gynecology

## 2021-09-25 ENCOUNTER — Other Ambulatory Visit: Payer: Self-pay | Admitting: Obstetrics and Gynecology

## 2021-09-25 ENCOUNTER — Ambulatory Visit (INDEPENDENT_AMBULATORY_CARE_PROVIDER_SITE_OTHER): Payer: Managed Care, Other (non HMO) | Admitting: Obstetrics and Gynecology

## 2021-09-25 ENCOUNTER — Other Ambulatory Visit: Payer: Self-pay

## 2021-09-25 VITALS — BP 119/52 | HR 62 | Wt 186.6 lb

## 2021-09-25 DIAGNOSIS — Z3480 Encounter for supervision of other normal pregnancy, unspecified trimester: Secondary | ICD-10-CM

## 2021-09-25 DIAGNOSIS — O09523 Supervision of elderly multigravida, third trimester: Secondary | ICD-10-CM

## 2021-09-25 DIAGNOSIS — F17200 Nicotine dependence, unspecified, uncomplicated: Secondary | ICD-10-CM

## 2021-09-25 DIAGNOSIS — O24419 Gestational diabetes mellitus in pregnancy, unspecified control: Secondary | ICD-10-CM

## 2021-09-25 DIAGNOSIS — Z8279 Family history of other congenital malformations, deformations and chromosomal abnormalities: Secondary | ICD-10-CM

## 2021-09-25 MED ORDER — NICOTINE POLACRILEX 4 MG MT GUM: 4.0000 mg | CHEWING_GUM | OROMUCOSAL | 1 refills | Status: AC | PRN

## 2021-09-27 ENCOUNTER — Other Ambulatory Visit: Payer: Self-pay | Admitting: Obstetrics and Gynecology

## 2021-09-27 ENCOUNTER — Telehealth (HOSPITAL_COMMUNITY): Payer: Self-pay | Admitting: *Deleted

## 2021-09-27 NOTE — Telephone Encounter (Signed)
Preadmission screen  

## 2021-09-28 ENCOUNTER — Encounter (HOSPITAL_COMMUNITY): Payer: Self-pay

## 2021-09-28 ENCOUNTER — Encounter (HOSPITAL_COMMUNITY): Payer: Self-pay | Admitting: *Deleted

## 2021-09-28 ENCOUNTER — Telehealth (HOSPITAL_COMMUNITY): Payer: Self-pay | Admitting: *Deleted

## 2021-09-28 NOTE — Telephone Encounter (Signed)
Preadmission screen  

## 2021-10-01 ENCOUNTER — Encounter: Payer: Self-pay | Admitting: Obstetrics & Gynecology

## 2021-10-01 ENCOUNTER — Other Ambulatory Visit: Payer: Self-pay | Admitting: Obstetrics & Gynecology

## 2021-10-01 ENCOUNTER — Other Ambulatory Visit: Payer: Self-pay

## 2021-10-01 ENCOUNTER — Ambulatory Visit (INDEPENDENT_AMBULATORY_CARE_PROVIDER_SITE_OTHER): Payer: Managed Care, Other (non HMO) | Admitting: Obstetrics & Gynecology

## 2021-10-01 VITALS — BP 127/82 | HR 87 | Wt 185.5 lb

## 2021-10-01 DIAGNOSIS — O09523 Supervision of elderly multigravida, third trimester: Secondary | ICD-10-CM

## 2021-10-01 DIAGNOSIS — O0993 Supervision of high risk pregnancy, unspecified, third trimester: Secondary | ICD-10-CM

## 2021-10-01 DIAGNOSIS — Z3A38 38 weeks gestation of pregnancy: Secondary | ICD-10-CM

## 2021-10-01 DIAGNOSIS — O24419 Gestational diabetes mellitus in pregnancy, unspecified control: Secondary | ICD-10-CM

## 2021-10-01 NOTE — Progress Notes (Signed)
° °  PRENATAL VISIT NOTE  Subjective:  Nancy Peterson is a 41 y.o. G3P2002 at [redacted]w[redacted]d being seen today for ongoing prenatal care.  She is currently monitored for the following issues for this high-risk pregnancy and has Educated about COVID-19 virus infection; Supervision of high-risk pregnancy; Tobacco dependence; AMA (advanced maternal age) multigravida 48+; Family history of tuberous sclerosis; and GDM (gestational diabetes mellitus) on their problem list.  Patient reports occasional contractions.  Contractions: Irritability. Vag. Bleeding: None.  Movement: Present. Denies leaking of fluid.   The following portions of the patient's history were reviewed and updated as appropriate: allergies, current medications, past family history, past medical history, past social history, past surgical history and problem list.   Objective:   Vitals:   10/01/21 1040  BP: 127/82  Pulse: 87  Weight: 185 lb 8 oz (84.1 kg)    Fetal Status: Fetal Heart Rate (bpm): 140   Movement: Present  Presentation: Vertex  General:  Alert, oriented and cooperative. Patient is in no acute distress.  Skin: Skin is warm and dry. No rash noted.   Cardiovascular: Normal heart rate noted  Respiratory: Normal respiratory effort, no problems with respiration noted  Abdomen: Soft, gravid, appropriate for gestational age.  Pain/Pressure: Present     Pelvic: Cervical exam performed in the presence of a chaperone Dilation: 3 Effacement (%): 70 Station: -3  Extremities: Normal range of motion.  Edema: None  Mental Status: Normal mood and affect. Normal behavior. Normal judgment and thought content.   Assessment and Plan:  Pregnancy: G3P2002 at [redacted]w[redacted]d 1. Gestational diabetes mellitus (GDM) in third trimester, gestational diabetes method of control unspecified Normal blood sugars on review. Growth scan is scheduled for tomorrow. Continue diet. IOL scheduled on 10/11/2021.  2. Multigravida of advanced maternal age in third  trimester 3. [redacted] weeks gestation of pregnancy 4. Supervision of high risk pregnancy in third trimester Favorable cervix.  Term labor symptoms and general obstetric precautions including but not limited to vaginal bleeding, contractions, leaking of fluid and fetal movement were reviewed in detail with the patient. Please refer to After Visit Summary for other counseling recommendations.   Return in about 1 week (around 10/08/2021) for OFFICE OB VISIT (MD only).  Future Appointments  Date Time Provider [place]  10/02/2021 11:00 AM [facility] NURSE [facility]  10/02/2021 11:15 AM [facility] US2 [facility]  10/11/2021  7:00 AM [facility]-LD SCHED ROOM [facility]IN[facility] None    Verita Schneiders, MD

## 2021-10-01 NOTE — Patient Instructions (Signed)
Return to office for any scheduled appointments. Call the office or go to the MAU at [facility] if: You begin to have strong, frequent contractions Your water breaks.  Sometimes it is a big gush of fluid, sometimes it is just a trickle that keeps getting your panties wet or running down your legs You have vaginal bleeding.  It is normal to have a small amount of spotting if your cervix was checked.  You do not feel your baby moving like normal.  If you do not, get something to eat and drink and lay down and focus on feeling your baby move.   If your baby is still not moving like normal, you should call the office or go to MAU. Any other obstetric concerns.    Cervical Ripening (to get your cervix ready for labor) : May try one or all:  Red Raspberry Leaf capsules:  two 300mg or 400mg tablets with each meal, 2-3 times a day  Potential Side Effects Of Raspberry Leaf:  Most women do not experience any side effects from drinking raspberry leaf tea. However, nausea and loose stools are possible   Evening Primrose Oil capsules: may take 1 to 3 capsules daily. May also prick one to release the oil and insert it into your vagina at night.  Some of the potential side effects:  Upset stomach  Loose stools or diarrhea  Headaches  Nausea  6 Dates a day (may taste better if warmed in microwave until soft). Found where raisins are in the grocery store  

## 2021-10-02 ENCOUNTER — Ambulatory Visit (HOSPITAL_BASED_OUTPATIENT_CLINIC_OR_DEPARTMENT_OTHER): Payer: Managed Care, Other (non HMO)

## 2021-10-02 ENCOUNTER — Encounter: Payer: Self-pay | Admitting: *Deleted

## 2021-10-02 ENCOUNTER — Ambulatory Visit: Payer: Managed Care, Other (non HMO) | Attending: Obstetrics and Gynecology | Admitting: *Deleted

## 2021-10-02 VITALS — BP 115/68 | HR 73

## 2021-10-02 DIAGNOSIS — O24410 Gestational diabetes mellitus in pregnancy, diet controlled: Secondary | ICD-10-CM | POA: Insufficient documentation

## 2021-10-02 DIAGNOSIS — O09523 Supervision of elderly multigravida, third trimester: Secondary | ICD-10-CM | POA: Insufficient documentation

## 2021-10-02 DIAGNOSIS — Z3A38 38 weeks gestation of pregnancy: Secondary | ICD-10-CM

## 2021-10-02 DIAGNOSIS — Z8279 Family history of other congenital malformations, deformations and chromosomal abnormalities: Secondary | ICD-10-CM | POA: Insufficient documentation

## 2021-10-02 LAB — SARS CORONAVIRUS 2 (TAT 6-24 HRS): SARS Coronavirus 2: NEGATIVE

## 2021-10-03 ENCOUNTER — Other Ambulatory Visit: Payer: Self-pay | Admitting: Advanced Practice Midwife

## 2021-10-05 ENCOUNTER — Ambulatory Visit: Payer: Managed Care, Other (non HMO)

## 2021-10-08 ENCOUNTER — Other Ambulatory Visit: Payer: Self-pay

## 2021-10-08 ENCOUNTER — Ambulatory Visit (INDEPENDENT_AMBULATORY_CARE_PROVIDER_SITE_OTHER): Payer: Managed Care, Other (non HMO) | Admitting: Obstetrics and Gynecology

## 2021-10-08 VITALS — BP 130/86 | HR 95 | Wt 183.3 lb

## 2021-10-08 DIAGNOSIS — O24410 Gestational diabetes mellitus in pregnancy, diet controlled: Secondary | ICD-10-CM

## 2021-10-08 DIAGNOSIS — O09523 Supervision of elderly multigravida, third trimester: Secondary | ICD-10-CM

## 2021-10-08 DIAGNOSIS — Z3A39 39 weeks gestation of pregnancy: Secondary | ICD-10-CM

## 2021-10-08 DIAGNOSIS — O0993 Supervision of high risk pregnancy, unspecified, third trimester: Secondary | ICD-10-CM

## 2021-10-08 NOTE — Progress Notes (Signed)
° °  PRENATAL VISIT NOTE  Subjective:  Nancy Peterson is a 41 y.o. G3P2002 at [redacted]w[redacted]d being seen today for ongoing prenatal care.  She is currently monitored for the following issues for this high-risk pregnancy and has Educated about COVID-19 virus infection; Supervision of high-risk pregnancy; Tobacco dependence; AMA (advanced maternal age) multigravida 62+; Family history of tuberous sclerosis; and GDM (gestational diabetes mellitus) on their problem list.  Patient reports no complaints.  Contractions: Irritability. Vag. Bleeding: None.  Movement: Present. Denies leaking of fluid.   The following portions of the patient's history were reviewed and updated as appropriate: allergies, current medications, past family history, past medical history, past social history, past surgical history and problem list.   Objective:   Vitals:   10/08/21 1127  BP: 130/86  Pulse: 95  Weight: 183 lb 4.8 oz (83.1 kg)    Fetal Status: Fetal Heart Rate (bpm): 145 Fundal Height: 39 cm Movement: Present  Presentation: Vertex  General:  Alert, oriented and cooperative. Patient is in no acute distress.  Skin: Skin is warm and dry. No rash noted.   Cardiovascular: Normal heart rate noted  Respiratory: Normal respiratory effort, no problems with respiration noted  Abdomen: Soft, gravid, appropriate for gestational age.  Pain/Pressure: Absent     Pelvic: Cervical exam performed in the presence of a chaperone Dilation: 3 Effacement (%): 50 Station: -2  Extremities: Normal range of motion.     Mental Status: Normal mood and affect. Normal behavior. Normal judgment and thought content.   Assessment and Plan:  Pregnancy: G3P2002 at [redacted]w[redacted]d 1. [redacted] weeks gestation of pregnancy Declined membrane stripping today. GBS neg  2. Diet controlled gestational diabetes mellitus (GDM) in third trimester Normal cbg values 1/24: 28%, 3025gm, ac 59%, afi 15.9 Pt for IOL on 2/2  3. Multigravida of advanced maternal age in third  trimester No issues  4. Supervision of high risk pregnancy in third trimester  Term labor symptoms and general obstetric precautions including but not limited to vaginal bleeding, contractions, leaking of fluid and fetal movement were reviewed in detail with the patient. Please refer to After Visit Summary for other counseling recommendations.   Return if symptoms worsen or fail to improve.  Future Appointments  Date Time Provider [place]  10/11/2021  7:00 AM [facility]-LD SCHED ROOM [facility]-INDC None    Aletha Halim, MD

## 2021-10-10 ENCOUNTER — Encounter (HOSPITAL_COMMUNITY): Payer: Self-pay | Admitting: Obstetrics & Gynecology

## 2021-10-11 ENCOUNTER — Inpatient Hospital Stay (HOSPITAL_COMMUNITY): Payer: Managed Care, Other (non HMO)

## 2021-10-11 ENCOUNTER — Inpatient Hospital Stay (HOSPITAL_COMMUNITY): Payer: Managed Care, Other (non HMO) | Admitting: Anesthesiology

## 2021-10-11 ENCOUNTER — Inpatient Hospital Stay (HOSPITAL_COMMUNITY)
Admission: AD | Admit: 2021-10-11 | Discharge: 2021-10-13 | DRG: 807 | Disposition: A | Discharge status: Home / Self Care | Payer: Managed Care, Other (non HMO) | Attending: Obstetrics & Gynecology | Admitting: Obstetrics & Gynecology

## 2021-10-11 DIAGNOSIS — O0990 Supervision of high risk pregnancy, unspecified, unspecified trimester: Secondary | ICD-10-CM

## 2021-10-11 DIAGNOSIS — O24420 Gestational diabetes mellitus in childbirth, diet controlled: Secondary | ICD-10-CM | POA: Diagnosis present

## 2021-10-11 DIAGNOSIS — Z3490 Encounter for supervision of normal pregnancy, unspecified, unspecified trimester: Principal | ICD-10-CM

## 2021-10-11 DIAGNOSIS — Z3A39 39 weeks gestation of pregnancy: Secondary | ICD-10-CM

## 2021-10-11 DIAGNOSIS — Z7982 Long term (current) use of aspirin: Secondary | ICD-10-CM

## 2021-10-11 DIAGNOSIS — O99334 Smoking (tobacco) complicating childbirth: Secondary | ICD-10-CM | POA: Diagnosis present

## 2021-10-11 DIAGNOSIS — F17210 Nicotine dependence, cigarettes, uncomplicated: Secondary | ICD-10-CM | POA: Diagnosis present

## 2021-10-11 DIAGNOSIS — Z20822 Contact with and (suspected) exposure to covid-19: Secondary | ICD-10-CM | POA: Diagnosis present

## 2021-10-11 LAB — CBC
HCT: 37 % (ref 36.0–46.0)
Hemoglobin: 12.7 g/dL (ref 12.0–15.0)
MCH: 32 pg (ref 26.0–34.0)
MCHC: 34.3 g/dL (ref 30.0–36.0)
MCV: 93.2 fL (ref 80.0–100.0)
Platelets: 291 10*3/uL (ref 150–400)
RBC: 3.97 MIL/uL (ref 3.87–5.11)
RDW: 13.5 % (ref 11.5–15.5)
WBC: 12.1 10*3/uL — ABNORMAL HIGH (ref 4.0–10.5)
nRBC: 0 % (ref 0.0–0.2)

## 2021-10-11 LAB — RESP PANEL BY RT-PCR (FLU A&B, COVID) ARPGX2
Influenza A by PCR: NEGATIVE
Influenza B by PCR: NEGATIVE
SARS Coronavirus 2 by RT PCR: NEGATIVE

## 2021-10-11 LAB — TYPE AND SCREEN
ABO/RH(D): O POS
Antibody Screen: NEGATIVE

## 2021-10-11 LAB — RPR: RPR Ser Ql: NONREACTIVE

## 2021-10-11 MED ORDER — LACTATED RINGERS IV SOLN
500.0000 mL | INTRAVENOUS | Status: DC | PRN
  Administered 2021-10-11: 500 mL via INTRAVENOUS

## 2021-10-11 MED ORDER — TERBUTALINE SULFATE 1 MG/ML IJ SOLN
0.2500 mg | Freq: Once | INTRAMUSCULAR | Status: AC | PRN
  Administered 2021-10-11: 0.25 mg via SUBCUTANEOUS
  Filled 2021-10-11: qty 1

## 2021-10-11 MED ORDER — LACTATED RINGERS IV SOLN
500.0000 mL | Freq: Once | INTRAVENOUS | Status: AC
  Administered 2021-10-11: 500 mL via INTRAVENOUS

## 2021-10-11 MED ORDER — ONDANSETRON HCL 4 MG/2ML IJ SOLN
4.0000 mg | Freq: Four times a day (QID) | INTRAMUSCULAR | Status: DC | PRN
  Administered 2021-10-11: 4 mg via INTRAVENOUS
  Filled 2021-10-11: qty 2

## 2021-10-11 MED ORDER — LACTATED RINGERS IV SOLN: INTRAVENOUS | Status: DC

## 2021-10-11 MED ORDER — EPHEDRINE 5 MG/ML INJ: 10.0000 mg | INTRAVENOUS | Status: DC | PRN

## 2021-10-11 MED ORDER — OXYTOCIN-SODIUM CHLORIDE 30-0.9 UT/500ML-% IV SOLN
2.5000 [IU]/h | INTRAVENOUS | Status: DC
  Administered 2021-10-11: 2.5 [IU]/h via INTRAVENOUS

## 2021-10-11 MED ORDER — ACETAMINOPHEN 325 MG PO TABS
650.0000 mg | ORAL_TABLET | ORAL | Status: DC | PRN
  Administered 2021-10-11: 650 mg via ORAL
  Filled 2021-10-11: qty 2

## 2021-10-11 MED ORDER — PHENYLEPHRINE 40 MCG/ML (10ML) SYRINGE FOR IV PUSH (FOR BLOOD PRESSURE SUPPORT): 80.0000 ug | PREFILLED_SYRINGE | INTRAVENOUS | Status: DC | PRN

## 2021-10-11 MED ORDER — NICOTINE 14 MG/24HR TD PT24: 14.0000 mg | MEDICATED_PATCH | Freq: Every day | TRANSDERMAL | Status: DC

## 2021-10-11 MED ORDER — OXYCODONE-ACETAMINOPHEN 5-325 MG PO TABS: 2.0000 | ORAL_TABLET | ORAL | Status: DC | PRN

## 2021-10-11 MED ORDER — LIDOCAINE HCL (PF) 1 % IJ SOLN
INTRAMUSCULAR | Status: DC | PRN
  Administered 2021-10-11: 2 mL via EPIDURAL
  Administered 2021-10-11: 10 mL via EPIDURAL

## 2021-10-11 MED ORDER — OXYTOCIN BOLUS FROM INFUSION
333.0000 mL | Freq: Once | INTRAVENOUS | Status: AC
  Administered 2021-10-11: 333 mL via INTRAVENOUS

## 2021-10-11 MED ORDER — FENTANYL CITRATE (PF) 100 MCG/2ML IJ SOLN
50.0000 ug | INTRAMUSCULAR | Status: DC | PRN
  Administered 2021-10-11: 100 ug via INTRAVENOUS
  Filled 2021-10-11: qty 2

## 2021-10-11 MED ORDER — PHENYLEPHRINE 40 MCG/ML (10ML) SYRINGE FOR IV PUSH (FOR BLOOD PRESSURE SUPPORT)
80.0000 ug | PREFILLED_SYRINGE | INTRAVENOUS | Status: DC | PRN
  Filled 2021-10-11: qty 10

## 2021-10-11 MED ORDER — FENTANYL-BUPIVACAINE-NACL 0.5-0.125-0.9 MG/250ML-% EP SOLN
EPIDURAL | Status: DC | PRN
  Administered 2021-10-11: 12 mL/h via EPIDURAL

## 2021-10-11 MED ORDER — FENTANYL-BUPIVACAINE-NACL 0.5-0.125-0.9 MG/250ML-% EP SOLN
12.0000 mL/h | EPIDURAL | Status: DC | PRN
  Filled 2021-10-11: qty 250

## 2021-10-11 MED ORDER — DIPHENHYDRAMINE HCL 50 MG/ML IJ SOLN: 12.5000 mg | INTRAMUSCULAR | Status: DC | PRN

## 2021-10-11 MED ORDER — SOD CITRATE-CITRIC ACID 500-334 MG/5ML PO SOLN
30.0000 mL | ORAL | Status: DC | PRN
  Administered 2021-10-11: 30 mL via ORAL
  Filled 2021-10-11: qty 30

## 2021-10-11 MED ORDER — OXYCODONE-ACETAMINOPHEN 5-325 MG PO TABS: 1.0000 | ORAL_TABLET | ORAL | Status: DC | PRN

## 2021-10-11 MED ORDER — OXYTOCIN-SODIUM CHLORIDE 30-0.9 UT/500ML-% IV SOLN
1.0000 m[IU]/min | INTRAVENOUS | Status: DC
  Administered 2021-10-11 (×2): 2 m[IU]/min via INTRAVENOUS
  Filled 2021-10-11: qty 500

## 2021-10-11 MED ORDER — LIDOCAINE HCL (PF) 1 % IJ SOLN: 30.0000 mL | INTRAMUSCULAR | Status: DC | PRN

## 2021-10-11 NOTE — H&P (Addendum)
OBSTETRIC ADMISSION HISTORY AND PHYSICAL  Nancy Peterson is a 41 y.o. female G3P2002 with IUP at 74w3dby LMP presenting for IOL d/t A1GDM. She reports +FMs, No LOF, no VB.  She plans on breast feeding. She plans for husband to get vasectomy. If not done by 6 week check, she will consider other MOC.  [facility]  Dating: By LMP --->  Estimated Date of Delivery: 10/15/21  Sono:    @[redacted]w[redacted]d , CWD, normal anatomy, cephalic presentation, anterior placenta, 3025g, 28% EFW   Prenatal History/Complications:  -[id number] -tobacco dependence has smoked 1/2-3/4 pack/day during pregnancy -FOB family history of tuberous sclerosis  Past Medical History: Past Medical History:  Diagnosis Date   Chest pain    Gestational diabetes    History of kidney stones    Vaginal Pap smear, abnormal 2014    Past Surgical History: Past Surgical History:  Procedure Laterality Date   KidneyStone Removal      Obstetrical History: OB History     Gravida  3   Para  2   Term  2   Preterm  0   AB  0   Living  2      SAB  0   IAB  0   Ectopic  0   Multiple  0   Live Births  2           Social History Social History   Socioeconomic History   Marital status: Married    Spouse name: Not on file   Number of children: 2   Years of education: Not on file   Highest education level: Not on file  Occupational History   Not on file  Tobacco Use   Smoking status: Every Day    Packs/day: 0.50    Types: Cigarettes   Smokeless tobacco: Never  Vaping Use   Vaping Use: Never used  Substance and Sexual Activity   Alcohol use: Not Currently   Drug use: Never   Sexual activity: Not Currently    Birth control/protection: None  Other Topics Concern   Not on file  Social History Narrative   Not on file   Social Determinants of Health   Financial Resource Strain: Not on file  Food Insecurity: Food Insecurity Present   Wor[place] the  Last Year: Sometimes true   Ran Out of Food in the Last Year: Never true  Transportation Needs: No Transportation Needs   Lack of Transportation (Medical): No   Lack of Transportation (Non-Medical): No  Physical Activity: Not on file  Stress: Not on file  Social Connections: Not on file    Family History: Family History  Problem Relation Age of Onset   Heart disease Mother    COPD Mother    Heart disease Father    COPD Father     Allergies: Allergies  Allergen Reactions   Penicillins Hives and Rash    Medications Prior to Admission  Medication Sig Dispense Refill Last Dose   Accu-Chek Softclix Lancets lancets Use as instructed 100 each 12    aspirin EC 81 MG tablet Take 1 tablet (81 mg total) by mouth daily. Swallow whole. 30 tablet 11    glucose blood test strip Use as instructed 100 each 12    nicotine polacrilex (NICORETTE) 4 MG gum Take 1 each (4 mg total) by mouth as needed for smoking cessation. 100 tablet 1    Prenatal Vit-Fe Fumarate-FA (  PRENATAL PLUS VITAMIN/MINERAL) 27-1 MG TABS Take 1 tablet by mouth daily at 6 (six) AM. 30 tablet 12      Review of Systems   All systems reviewed and negative except as stated in HPI  Blood pressure 129/78, pulse 85, temperature 98.6 F (37 C), temperature source Oral, last menstrual period 01/08/2021, unknown if currently breastfeeding. General appearance: alert, cooperative, and no distress Lungs: normal WOB Heart: well perfused  Abdomen: soft, non-tender, gravid Presentation: cephalic Fetal monitoringBaseline: 135 bpm, Variability: Good {> 6 bpm), Accelerations: Reactive, and Decelerations: Absent Uterine activity irregular ctx, q 4-7 minutes Dilation: 3 Effacement (%): 50 Station: -2 Exam by:: Jaymes Graff RN   Prenatal labs: ABO, Rh: --/--/PENDING (02/02 0800) Antibody: PENDING (02/02 0800) Rubella: 1.58 (07/19 1602) RPR: Non Reactive (11/16 0833)  HBsAg: Negative (07/19 1602)  HIV: Non Reactive (11/16  0833)  GBS: Negative/-- (01/09 1656)  1 hr Glucola 187, failed Genetic screening  NIPS LR, AFP too late, Horizon neg Anatomy US normal  Prenatal Transfer Tool  Maternal Diabetes: Yes:  Diabetes Type:  Diet controlled Genetic Screening: Normal Maternal Ultrasounds/Referrals: Normal Fetal Ultrasounds or other Referrals:  Referred to Materal Fetal Medicine  Maternal Substance Abuse:  Yes:  Type: Smoker Significant Maternal Medications:  None Significant Maternal Lab Results: None  Results for orders placed or performed during the hospital encounter of 10/11/21 (from the past 24 hour(s))  Type and screen   Collection Time: 10/11/21  8:00 AM  Result Value Ref Range   ABO/RH(D) PENDING    Antibody Screen PENDING    Sample Expiration      10/14/2021,2359 Performed at [facility], [address], [place], [place] [address]   CBC   Collection Time: 10/11/21  8:13 AM  Result Value Ref Range   WBC 12.1 (H) 4.0 - 10.5 K/uL   RBC 3.97 3.87 - 5.11 MIL/uL   Hemoglobin 12.7 12.0 - 15.0 g/dL   HCT 37.0 36.0 - 46.0 %   MCV 93.2 80.0 - 100.0 fL   MCH 32.0 26.0 - 34.0 pg   MCHC 34.3 30.0 - 36.0 g/dL   RDW 13.5 11.5 - 15.5 %   Platelets 291 150 - 400 K/uL   nRBC 0.0 0.0 - 0.2 %    Patient Active Problem List   Diagnosis Date Noted   Pregnancy 10/11/2021   GDM (gestational diabetes mellitus) 07/31/2021   AMA (advanced maternal age) multigravida 35+ 03/27/2021   Family history of tuberous sclerosis 03/27/2021   Supervision of high-risk pregnancy 02/27/2021   Educated about COVID-19 virus infection 01/13/2020   Tobacco dependence 08/15/2016    Assessment/Plan:  Nancy Peterson is a 41 y.o. G3P2002 at 35w3dhere for IOL d/t A1GDM  #Labor: Cervix is favorable. Will start induction with pitocin and perform next cervical check about 4 hours from the last or sooner if indicated.  #Pain: Planning for epidural  #FWB: Cat 1 #ID:  GBS negative #MOF: Breast feeding #MOC:vasectomy #Circ:   yes  SPrecious Gilding DO  10/11/2021, 9:18 AM  I personally saw and evaluated the patient, performing the key elements of the service. I developed and verified the management plan that is described in the resident's/student's note, and I agree with the content with my edits above. VSS, HRR&R, Resp unlabored, Legs neg.  FNigel Berthold CNM 10/11/2021 10:22 AM

## 2021-10-11 NOTE — Anesthesia Procedure Notes (Signed)
Epidural Patient location during procedure: OB Start time: 10/11/2021 2:34 PM End time: 10/11/2021 2:44 PM  Staffing Anesthesiologist: Lannie Fields, DO Performed: anesthesiologist   Preanesthetic Checklist Completed: patient identified, IV checked, risks and benefits discussed, monitors and equipment checked, pre-op evaluation and timeout performed  Epidural Patient position: sitting Prep: DuraPrep and site prepped and draped Patient monitoring: continuous pulse ox, blood pressure, heart rate and cardiac monitor Approach: midline Location: L3-L4 Injection technique: LOR air  Needle:  Needle type: Tuohy  Needle gauge: 17 G Needle length: 9 cm Needle insertion depth: 7 cm Catheter type: closed end flexible Catheter size: 19 Gauge Catheter at skin depth: 12 cm Test dose: negative  Assessment Sensory level: T8 Events: blood not aspirated, injection not painful, no injection resistance, no paresthesia and negative IV test  Additional Notes Patient identified. Risks/Benefits/Options discussed with patient including but not limited to bleeding, infection, nerve damage, paralysis, failed block, incomplete pain control, headache, blood pressure changes, nausea, vomiting, reactions to medication both or allergic, itching and postpartum back pain. Confirmed with bedside nurse the patient's most recent platelet count. Confirmed with patient that they are not currently taking any anticoagulation, have any bleeding history or any family history of bleeding disorders. Patient expressed understanding and wished to proceed. All questions were answered. Sterile technique was used throughout the entire procedure. Please see nursing notes for vital signs. Test dose was given through epidural catheter and negative prior to continuing to dose epidural or start infusion. Warning signs of high block given to the patient including shortness of breath, tingling/numbness in hands, complete motor block,  or any concerning symptoms with instructions to call for help. Patient was given instructions on fall risk and not to get out of bed. All questions and concerns addressed with instructions to call with any issues or inadequate analgesia.  Reason for block:procedure for pain

## 2021-10-11 NOTE — Discharge Summary (Signed)
Postpartum Discharge Summary  Date of Service updated 10/13/21     Patient Name: Nancy Peterson DOB: [birth year] MRN: [id number]  Date of admission: 10/11/2021 Delivery date:10/11/2021  Delivering provider: Christin Fudge  Date of discharge: 10/13/2021  Admitting diagnosis: Pregnancy [Z34.90] Intrauterine pregnancy: [redacted]w[redacted]d    Secondary diagnosis:  Principal Problem:   Pregnancy  Additional problems: A1DM    Discharge diagnosis: Term Pregnancy Delivered                                              Post partum procedures: none Augmentation: AROM and Pitocin Complications: None  Hospital course: Induction of Labor With Vaginal Delivery   41y.o. yo G3P2002 at 385w3das admitted to the hospital 10/11/2021 for induction of labor.  Indication for induction: A1 DM.  Patient had an uncomplicated labor course as follows: Membrane Rupture Time/Date: 3:30 PM ,10/11/2021   Delivery Method:Vaginal, Spontaneous  Episiotomy: None  Lacerations:  None  Details of delivery can be found in separate delivery note.  Patient had a routine postpartum course. Patient is discharged home 10/13/21.  Newborn Data: Birth date:10/11/2021  Birth time:10:38 PM  Gender:Female  Living status:Living  Apgars:9 ,9  Weight:3350 g   Magnesium Sulfate received: No BMZ received: No Rhophylac:N/A MMR:N/A T-DaP:Given prenatally Flu: No Transfusion:No  Physical exam  Vitals:   10/12/21 1035 10/12/21 1405 10/12/21 2215 10/13/21 0638  BP: 125/60 118/68 125/71 132/74  Pulse: 65 (!) 56 (!) 54 67  Resp: _0 Temp: 97.8 F (36.6 C) 98.4 F (36.9 C) 97.9 F (36.6 C) 97.7 F (36.5 C)  TempSrc: Oral Axillary Oral Oral  SpO2:      Weight:      Height:       General: alert, cooperative, and no distress Lochia: appropriate Uterine Fundus: firm Incision: N/A DVT Evaluation: No evidence of DVT seen on physical exam. Labs: Lab Results  Component Value Date   WBC 12.1 (H) 10/11/2021   HGB 12.7  10/11/2021   HCT 37.0 10/11/2021   MCV 93.2 10/11/2021   PLT 291 10/11/2021   CMP Latest Ref Rng & Units 03/22/2021  Glucose 70 - 99 mg/dL 93  BUN 6 - 20 mg/dL 5(L)  Creatinine 0.44 - 1.00 mg/dL 0.51  Sodium 135 - 145 mmol/L 133(L)  Potassium 3.5 - 5.1 mmol/L 3.8  Chloride 98 - 111 mmol/L 101  CO2 22 - 32 mmol/L 23  Calcium 8.9 - 10.3 mg/dL 9.3  Total Protein 6.5 - 8.1 g/dL 6.9  Total Bilirubin 0.3 - 1.2 mg/dL 0.5  Alkaline Phos 38 - 126 U/L 43  AST 15 - 41 U/L 30  ALT 0 - 44 U/L 28   Edinburgh Score: Edinburgh Postnatal Depression Scale Screening Tool 10/12/2021  I have been able to laugh and see the funny side of things. 0  I have looked forward with enjoyment to things. 0  I have blamed myself unnecessarily when things went wrong. 0  I have been anxious or worried for no good reason. 0  I have felt scared or panicky for no good reason. 0  Things have been getting on top of me. 0  I have been so unhappy that I have had difficulty sleeping. 0  I have felt sad or miserable. 0  I have been so unhappy that I have been crying. 0  The thought of harming myself has occurred to me. 0  Edinburgh Postnatal Depression Scale Total 0     After visit meds:  Allergies as of 10/13/2021       Reactions   Penicillins Hives, Rash        Medication List     STOP taking these medications    Accu-Chek Softclix Lancets lancets   aspirin EC 81 MG tablet   glucose blood test strip       TAKE these medications    acetaminophen 325 MG tablet Commonly known as: Tylenol Take 2 tablets (650 mg total) by mouth every 4 (four) hours as needed (for pain scale < 4).   ibuprofen 600 MG tablet Commonly known as: ADVIL Take 1 tablet (600 mg total) by mouth every 6 (six) hours.   nicotine polacrilex 4 MG gum Commonly known as: NICORETTE Take 1 each (4 mg total) by mouth as needed for smoking cessation.   Prenatal Plus Vitamin/Mineral 27-1 MG Tabs Take 1 tablet by mouth daily at 6  (six) AM.         Discharge home in stable condition Infant Feeding: Breast Infant Disposition:home with mother Discharge instruction: per After Visit Summary and Postpartum booklet. Activity: Advance as tolerated. Pelvic rest for 6 weeks.  Diet: routine diet Future Appointments: Future Appointments  Date Time Provider [place]  11/09/2021  9:15 AM Kandace Blitz [facility]  11/13/2021  8:50 AM [facility] [place]   Follow up Visit:  [place] [facility] Follow up.   Specialty: Obstetrics and Gynecology Why: In 4-6 weeks for postpartum visit Contact information: [place] [id number] [phone]                 Please schedule this patient for a Virtual postpartum visit in 4 weeks with the following provider: Any provider. Additional Postpartum F/U:2 hour GTT  Low risk pregnancy complicated by: GDM Delivery mode:  Vaginal, Spontaneous  Anticipated Birth Control:   vasectomy   10/13/2021 Fatima Blank, CNM

## 2021-10-11 NOTE — Anesthesia Preprocedure Evaluation (Signed)
Anesthesia Evaluation  Patient identified by MRN, date of birth, ID band Patient awake    Reviewed: Allergy & Precautions, Patient's Chart, lab work & pertinent test results  Airway Mallampati: II  TM Distance: >3 FB Neck ROM: Full    Dental no notable dental hx.    Pulmonary Current Smoker,    Pulmonary exam normal breath sounds clear to auscultation       Cardiovascular negative cardio ROS Normal cardiovascular exam Rhythm:Regular Rate:Normal     Neuro/Psych negative neurological ROS  negative psych ROS   GI/Hepatic negative GI ROS, Neg liver ROS,   Endo/Other  diabetes, Well Controlled, Gestational  Renal/GU negative Renal ROS  negative genitourinary   Musculoskeletal negative musculoskeletal ROS (+)   Abdominal   Peds negative pediatric ROS (+)  Hematology hct 37, plt 291   Anesthesia Other Findings   Reproductive/Obstetrics (+) Pregnancy                             Anesthesia Physical Anesthesia Plan  ASA: 2  Anesthesia Plan: Epidural   Post-op Pain Management:    Induction:   PONV Risk Score and Plan: 2  Airway Management Planned: Natural Airway  Additional Equipment: None  Intra-op Plan:   Post-operative Plan:   Informed Consent: I have reviewed the patients History and Physical, chart, labs and discussed the procedure including the risks, benefits and alternatives for the proposed anesthesia with the patient or authorized representative who has indicated his/her understanding and acceptance.       Plan Discussed with:   Anesthesia Plan Comments:         Anesthesia Quick Evaluation

## 2021-10-11 NOTE — Progress Notes (Signed)
Patient Vitals for the past 4 hrs:  BP Pulse  10/11/21 1521 131/69 63  10/11/21 1516 117/69 (!) 58  10/11/21 1511 122/74 (!) 58  10/11/21 1506 119/64 (!) 57  10/11/21 1501 119/70 60  10/11/21 1456 125/74 72  10/11/21 1451 135/81 79  10/11/21 1446 132/80 78  10/11/21 1442 132/62 64  10/11/21 1401 120/72 (!) 58  10/11/21 1331 (!) 106/51 (!) 56  10/11/21 1309 113/63 63  10/11/21 1230 124/72 68   Comfortable w/epidural.  Cx 3-4/70/-2.  AROM w/lg amount clear fluid.  Bradycardia to 60-80s for 6-7 minutes. No palpable cord, occult or otherwise.  Pit off, terbutaline given SQ, position changes. FHR now 120s. Moderate variability, + accels, occ early decel. Will restart Pitocin in about 30 minutes as long as FHR remains Cat 1.

## 2021-10-12 MED ORDER — DIBUCAINE (PERIANAL) 1 % EX OINT: 1.0000 "application " | TOPICAL_OINTMENT | CUTANEOUS | Status: DC | PRN

## 2021-10-12 MED ORDER — SIMETHICONE 80 MG PO CHEW: 80.0000 mg | CHEWABLE_TABLET | ORAL | Status: DC | PRN

## 2021-10-12 MED ORDER — DOCUSATE SODIUM 100 MG PO CAPS
100.0000 mg | ORAL_CAPSULE | Freq: Two times a day (BID) | ORAL | Status: DC
  Administered 2021-10-12 – 2021-10-13 (×3): 100 mg via ORAL
  Filled 2021-10-12 (×3): qty 1

## 2021-10-12 MED ORDER — MEDROXYPROGESTERONE ACETATE 150 MG/ML IM SUSP: 150.0000 mg | INTRAMUSCULAR | Status: DC | PRN

## 2021-10-12 MED ORDER — BENZOCAINE-MENTHOL 20-0.5 % EX AERO: 1.0000 "application " | INHALATION_SPRAY | CUTANEOUS | Status: DC | PRN

## 2021-10-12 MED ORDER — ONDANSETRON HCL 4 MG PO TABS: 4.0000 mg | ORAL_TABLET | ORAL | Status: DC | PRN

## 2021-10-12 MED ORDER — IBUPROFEN 600 MG PO TABS
600.0000 mg | ORAL_TABLET | Freq: Four times a day (QID) | ORAL | Status: DC
  Administered 2021-10-12 – 2021-10-13 (×7): 600 mg via ORAL
  Filled 2021-10-12 (×7): qty 1

## 2021-10-12 MED ORDER — ACETAMINOPHEN 325 MG PO TABS
650.0000 mg | ORAL_TABLET | ORAL | Status: DC | PRN
  Administered 2021-10-12 (×2): 650 mg via ORAL
  Filled 2021-10-12 (×2): qty 2

## 2021-10-12 MED ORDER — METHYLERGONOVINE MALEATE 0.2 MG PO TABS: 0.2000 mg | ORAL_TABLET | ORAL | Status: DC | PRN

## 2021-10-12 MED ORDER — ONDANSETRON HCL 4 MG/2ML IJ SOLN: 4.0000 mg | INTRAMUSCULAR | Status: DC | PRN

## 2021-10-12 MED ORDER — BISACODYL 10 MG RE SUPP: 10.0000 mg | Freq: Every day | RECTAL | Status: DC | PRN

## 2021-10-12 MED ORDER — MEASLES, MUMPS & RUBELLA VAC IJ SOLR: 0.5000 mL | Freq: Once | INTRAMUSCULAR | Status: DC

## 2021-10-12 MED ORDER — WITCH HAZEL-GLYCERIN EX PADS: 1.0000 "application " | MEDICATED_PAD | CUTANEOUS | Status: DC | PRN

## 2021-10-12 MED ORDER — COCONUT OIL OIL
1.0000 "application " | TOPICAL_OIL | Status: DC | PRN
  Administered 2021-10-12: 1 via TOPICAL

## 2021-10-12 MED ORDER — TETANUS-DIPHTH-ACELL PERTUSSIS 5-2.5-18.5 LF-MCG/0.5 IM SUSY: 0.5000 mL | PREFILLED_SYRINGE | Freq: Once | INTRAMUSCULAR | Status: DC

## 2021-10-12 MED ORDER — PRENATAL MULTIVITAMIN CH
1.0000 | ORAL_TABLET | Freq: Every day | ORAL | Status: DC
  Administered 2021-10-12 – 2021-10-13 (×2): 1 via ORAL
  Filled 2021-10-12 (×2): qty 1

## 2021-10-12 MED ORDER — FERROUS SULFATE 325 (65 FE) MG PO TABS
325.0000 mg | ORAL_TABLET | ORAL | Status: DC
  Administered 2021-10-12: 325 mg via ORAL
  Filled 2021-10-12: qty 1

## 2021-10-12 MED ORDER — METHYLERGONOVINE MALEATE 0.2 MG/ML IJ SOLN: 0.2000 mg | INTRAMUSCULAR | Status: DC | PRN

## 2021-10-12 MED ORDER — DIPHENHYDRAMINE HCL 25 MG PO CAPS: 25.0000 mg | ORAL_CAPSULE | Freq: Four times a day (QID) | ORAL | Status: DC | PRN

## 2021-10-12 MED ORDER — FLEET ENEMA 7-19 GM/118ML RE ENEM: 1.0000 | ENEMA | Freq: Every day | RECTAL | Status: DC | PRN

## 2021-10-12 NOTE — Lactation Note (Signed)
This note was copied from a baby's chart. Lactation Consultation Note  Patient Name: Nancy Peterson [id number] Date: 10/12/2021 Reason for consult: Initial assessment;1st time breastfeeding;Term Age:40 hours Per mom, she feels infant is latching well, she knows to break latch if she feels pain and not a tug. LC did not observe latch, infant asleep in mom arms, per mom, RN assisted with latch at 0000 am and infant breastfeed for 10 minutes. Mom made aware of O/P services, breastfeeding support groups, community resources, and our phone # for post-discharge questions.   Mom's plan: 1- Mom will continue to breastfeed infant according to hunger cues, 8 to 12+ or more times within 24 hours, skin to skin. 2- Mom knows to breast latch if she feels pain or discomfort and not a tug while breastfeeding. 3- Mom knows to try and BF infant on both breast during a feeding. 4- Mom will call RN/LC if she need further assistance with latching infant at the breast.  Maternal Data Has patient been taught Hand Expression?: Yes Does the patient have breastfeeding experience prior to this delivery?: No  Feeding Mother's Current Feeding Choice: Breast Milk  LATCH Score                    Lactation Tools Discussed/Used    Interventions Interventions: Breast feeding basics reviewed;Skin to skin;Position options;Hand express;Education;LC Services brochure  Discharge    Consult Status Consult Status: Follow-up Date: 10/12/21 Follow-up type: In-patient    Danelle Earthly 10/12/2021, 1:49 AM

## 2021-10-12 NOTE — Lactation Note (Addendum)
This note was copied from a baby's chart. Lactation Consultation Note  Patient Name: Nancy Peterson [id number] Date: 10/12/2021 Reason for consult: Follow-up assessment Age:40 hours, term female infant. -1% weight loss. Per mom, infant recently BF for 20 minutes on both breast at 1830 pm. LC did not observe latch at this time, infant is asleep in mom's arms. Mom feels breastfeeding is going well and infant latches better in football hold, RN Leisure centre manager) help her earlier today with latching infant at the breast.  Per mom, she is starting to feel cramping when infant latches at the breast, LC mention this is normal her uterus is shrinking back to size and the breastfeeding is  helping  her have less bleeding. When cramping mom knows she can ask RN for pain meds if needed.  Mom doesn't have any questions or concerns for LC today. Mom understands infant may start cluster feeding at 24 hours and this is normal infant behavior.  Mom will continue to breastfeed infant according to hunger cues, 8 to 12+ or more times within 24 hours, skin to skin. Maternal Data    Feeding Mother's Current Feeding Choice: Breast Milk  LATCH Score                    Lactation Tools Discussed/Used    Interventions Interventions: Skin to skin;Breast compression;Position options  Discharge    Consult Status Consult Status: Follow-up Date: 10/13/21 Follow-up type: In-patient    Danelle Earthly 10/12/2021, 8:41 PM

## 2021-10-12 NOTE — Anesthesia Postprocedure Evaluation (Signed)
Anesthesia Post Note  Patient: Nancy Peterson  Procedure(s) Performed: AN AD HOC LABOR EPIDURAL     Patient location during evaluation: Mother Baby Anesthesia Type: Epidural Level of consciousness: awake and alert and oriented Pain management: satisfactory to patient Vital Signs Assessment: post-procedure vital signs reviewed and stable Respiratory status: respiratory function stable Cardiovascular status: stable Postop Assessment: no headache, no backache, epidural receding, patient able to bend at knees, no signs of nausea or vomiting, adequate PO intake and able to ambulate Anesthetic complications: no   No notable events documented.  Last Vitals:  Vitals:   10/12/21 0631 10/12/21 0815  BP: (!) 99/56 121/64  Pulse: 61 63  Resp: 18 16  Temp: 36.8 C 36.7 C  SpO2: 99% 98%    Last Pain:  Vitals:   10/12/21 0815  TempSrc: Oral  PainSc:    Pain Goal: Patients Stated Pain Goal: 0 (10/11/21 1240)                 Trinda Harlacher

## 2021-10-12 NOTE — Progress Notes (Signed)
Post Partum Day 1 Subjective: no complaints, up ad lib, voiding and tolerating PO, small lochia, plans to breastfeed, vasectomy  Objective: Blood pressure (!) 99/56, pulse 61, temperature 98.2 F (36.8 C), temperature source Oral, resp. rate 18, height 5\' 3"  (1.6 m), weight 84.5 kg, last menstrual period 01/08/2021, SpO2 99 %, unknown if currently breastfeeding.  Physical Exam:  General: alert, cooperative and no distress Lochia:normal flow Chest: CTAB Heart: RRR no m/r/g Abdomen: +BS, soft, nontender,  Uterine Fundus: firm DVT Evaluation: No evidence of DVT seen on physical exam. Extremities: trace edema  Recent Labs    10/11/21 0813  HGB 12.7  HCT 37.0    Assessment/Plan: Plan for discharge tomorrow, Breastfeeding, Lactation consult, and Circumcision prior to discharge   LOS: 1 day   12/09/21 10/12/2021, 7:49 AM

## 2021-10-13 MED ORDER — IBUPROFEN 600 MG PO TABS
600.0000 mg | ORAL_TABLET | Freq: Four times a day (QID) | ORAL | 0 refills | Status: AC
Start: 2021-10-13 — End: ?

## 2021-10-13 MED ORDER — ACETAMINOPHEN 325 MG PO TABS: 650.0000 mg | ORAL_TABLET | ORAL | 0 refills | Status: AC | PRN

## 2021-10-13 NOTE — Lactation Note (Signed)
This note was copied from a baby's chart. Lactation Consultation Note  Patient Name: Nancy Peterson [id number] Date: 10/13/2021 Reason for consult: Follow-up assessment;Mother's request;Difficult latch;Term;Breastfeeding assistance Age: 40 hrs Infant not sustaining latch at the breast. Mom stated prefer to pump and bottle feed.  LC reviewed feeding volumes for infant not going to breast and guide provided.    Plan 1. To feed based on cues 8-12x 24hr period.  2. Mom to supplement with EBM first followed by formula.  3. Mom to use manual pump q 3hrs for 10 min each breast, transition at home with use dEBPq 3hr for 15 min   All questions answered at the end of the visit.   Maternal Data Has patient been taught Hand Expression?: Yes  Feeding Mother's Current Feeding Choice: Breast Milk and Formula Nipple Type: Nfant Slow Flow (purple)  LATCH Score Latch: Repeated attempts needed to sustain latch, nipple held in mouth throughout feeding, stimulation needed to elicit sucking reflex.  Audible Swallowing: Spontaneous and intermittent  Type of Nipple: Everted at rest and after stimulation  Comfort (Breast/Nipple): Filling, red/small blisters or bruises, mild/mod discomfort  Hold (Positioning): Assistance needed to correctly position infant at breast and maintain latch.  LATCH Score: 7   Lactation Tools Discussed/Used Tools: Pump;Flanges Flange Size: 24;27 Breast pump type: Manual Pump Education: Setup, frequency, and cleaning;Milk Storage Reason for Pumping: increase stimulation Pumping frequency: every 3 hrs for 10 min each breast  Interventions Interventions: Breast feeding basics reviewed;Assisted with latch;Skin to skin;Breast massage;Hand express;Breast compression;Adjust position;Support pillows;Position options;Expressed milk;Hand pump;Education;Pace feeding;LC Psychologist, educational;Infant Driven Feeding Algorithm education  Discharge Discharge Education: Engorgement and  breast care;Warning signs for feeding baby  Consult Status Consult Status: Complete Date: 10/14/21    Luisa Louk  Nicholson-Springer 10/13/2021, 3:17 PM

## 2021-10-23 ENCOUNTER — Telehealth (HOSPITAL_COMMUNITY): Payer: Self-pay | Admitting: *Deleted

## 2021-10-23 NOTE — Telephone Encounter (Signed)
Phone voicemail message left to return nurse call.  Duffy Rhody, RN 10-23-2021 at 2:11pm

## 2021-10-29 ENCOUNTER — Telehealth: Payer: Self-pay | Admitting: Lactation Services

## 2021-10-29 NOTE — Telephone Encounter (Signed)
Mom with concerns with low milk supply. She reports infant was initially only BF and eventually wanted to stay latched for 1-1.5 hours. Infant was supplemented in the hospital on day 2 of life. She did not pump in the hospital. Once home she was only able to pump 2 ounces total throughout the day, now she is getting about a tablespoon over 3-4 sessions.   Infant will latch some, she does not latch him regularly, encouraged her to latch infant consistently.   WIC informed mom to perform skin to skin while pumping. She tried and not getting much out now. She was hand expressing previously and did note more, she has been hand expressing. Encouraged her to try to hand express post pumping.   She tried to breast feed her 1st 2 infants when she got engorged she stopped. She has not gotten engorged with this infant.    Mom is pumping every 3-4 hours and using a Spectra S2 with # 24 flanges. She denies a lot of tissue pulling into barrel. Reviewed supply and demand and importance of emptying the breasts 8-12 times a day.   She is drinking about 32-64 ounces of fluids a day, advised to increase. She is eating ok. She is still having mild bleeding since leaving the hospital and is 3 weeks postpartum now. No new meds.   She is feeling stressed when pumping. Reviewed relaxing with pumping.   Reviewed Fenugreek, Moringa, Legendairy Milk Fenugreek Free Products and Reglan. Reviewed dosages, where to obtain and side effects.   Offered OP Lactation appointment, she is not interested at this time.   Mom will try some of the above suggestions and will call back as needed.

## 2021-11-08 ENCOUNTER — Other Ambulatory Visit: Payer: Self-pay | Admitting: General Practice

## 2021-11-08 DIAGNOSIS — Z8632 Personal history of gestational diabetes: Secondary | ICD-10-CM

## 2021-11-09 ENCOUNTER — Encounter: Payer: Self-pay | Admitting: Family

## 2021-11-09 ENCOUNTER — Telehealth (INDEPENDENT_AMBULATORY_CARE_PROVIDER_SITE_OTHER): Payer: Managed Care, Other (non HMO) | Admitting: Family

## 2021-11-09 DIAGNOSIS — Z716 Tobacco abuse counseling: Secondary | ICD-10-CM

## 2021-11-09 NOTE — Progress Notes (Addendum)
Provider location: [facility]  ?  ?Patient location: Home ?  ?I connected with Nancy Peterson on 11/09/21 at  9:15 AM EST by Mychart Video Encounter and verified that I am speaking with the correct person using two identifiers.     ?  ?I discussed the limitations, risks, security and privacy concerns of performing an evaluation and management service virtually and the availability of in person appointments. I also discussed with the patient that there may be a patient responsible charge related to this service. The patient expressed understanding and agreed to proceed. ?  ?Post Partum Visit Note ?Subjective:  ?  ?Nancy Peterson is a 41 y.o. G16P2002 female who presents for a postpartum visit. She is 4 weeks postpartum following a normal spontaneous vaginal delivery.  I have fully reviewed the prenatal and intrapartum course. The delivery was at [redacted]w[redacted]d gestational weeks.  Anesthesia: epidural. Postpartum course has been unremarkable. Baby is doing well. Baby is feeding by bottle - Gerber Gentle . Bleeding staining only. Bowel function is normal. Bladder function is normal. Patient is not sexually active. Contraception method is none. Postpartum depression screening: negative.  Minimal vaginal bleeding per patient.   ?  ?  ?The pregnancy intention screening data noted above was reviewed. Potential methods of contraception were discussed. The patient elected to proceed with abstinence until partner obtains vasectomy.   ?  Edinburgh Postnatal Depression Scale - 11/09/21 0918   ?  ?    ?     ?  Edinburgh Postnatal Depression Scale:  In the Past 7 Days  ?  I have been able to laugh and see the funny side of things. 0   ?  I have looked forward with enjoyment to things. 0   ?  I have blamed myself unnecessarily when things went wrong. 0   ?  I have been anxious or worried for no good reason. 0   ?  I have felt scared or panicky for no good reason. 0   ?  Things have been getting on top  of me. 0   ?  I have been so unhappy that I have had difficulty sleeping. 0   ?  I have felt sad or miserable. 0   ?  I have been so unhappy that I have been crying. 0   ?  The thought of harming myself has occurred to me. 0   ?  Edinburgh Postnatal Depression Scale Total 0   ?  ?   ?  ?  ?   ?  ?  ?The following portions of the patient's history were reviewed and updated as appropriate: allergies, current medications, past family history, past medical history, past social history, past surgical history, and problem list. ?  ?Review of Systems ?Pertinent items are noted in HPI. ?  ?Objective:  ?LMP 01/08/2021 (Exact Date)   Breastfeeding no, bottlefeeding   ?General:  Alert, oriented and cooperative. Patient is in no acute distress.  ?Respiratory: Normal respiratory effort, no problems with respiration noted  ?Mental Status: Normal mood and affect. Normal behavior. Normal judgment and thought content.  ?Rest of physical exam deferred due to type of encounter   ?Assessment:  ?  ? Normal postpartum exam. ?  ?Plan:  ?Essential components of care per ACOG recommendations: ?  ?1.  Mood and well being: Patient with negative depression screening today.  ?- Patient does use tobacco.  Desires to stop smoking, 1/2 pack/day.  Nancy Peterson  has tried to quit on her own x 2 in the past.  Highly motivated to quit.  Open to medication management.    ?- hx of drug use? No    ?  ?2. Infant care and feeding:  ?-Patient currently breastmilk feeding? No ? ?-Social determinants of health (SDOH) reviewed in EPIC. No concerns.  Utilizing wic and aware of food market.   ?  ?3. Sexuality, contraception and birth spacing ?- Patient does not want a pregnancy in the next year.  Desired family size is 3 children.  ?- Reviewed forms of contraception in tiered fashion. Patient desired abstinence, until partner obtains vasectomy.  .   ?  ?4. Sleep and fatigue ?-Encouraged family/partner/community support of 4 hrs of uninterrupted sleep to help with mood  and fatigue ?  ?5. Physical Recovery  ?- Discussed patients delivery:  without complications ?- Patient had no laceration ?- Patient has urinary incontinence? No ?- Patient is safe to resume physical and sexual activity ?  ?6.  Health Maintenance ?- Last pap smear done July 2022 and was normal with negative HPV. ? ?7. Primary Care ?- Currently utilizes [facility] and has a PCP at [facility]. ?  ?I provided 21 minutes of face-to-face virtual visit time during this encounter ? ?Amedeo Gory, CNM ? ?

## 2021-11-09 NOTE — Progress Notes (Signed)
? ?  Provider location: [facility]  ? ?Patient location: Home ? ?I connected with Tamera Punt on 11/09/21 at  9:15 AM EST by Mychart Video Encounter and verified that I am speaking with the correct person using two identifiers.     ?  ?I discussed the limitations, risks, security and privacy concerns of performing an evaluation and management service virtually and the availability of in person appointments. I also discussed with the patient that there may be a patient responsible charge related to this service. The patient expressed understanding and agreed to proceed. ? ?Post Partum Visit Note ?Subjective:  ? ?Nancy Peterson is a 41 y.o. G72P2002 female who presents for a postpartum visit. She is 4 weeks postpartum following a normal spontaneous vaginal delivery.  I have fully reviewed the prenatal and intrapartum course. The delivery was at [redacted]w[redacted]d gestational weeks.  Anesthesia: epidural. Postpartum course has been unremarkable. Baby is doing well. Baby is feeding by bottle - Gerber Gentle . Bleeding staining only. Bowel function is normal. Bladder function is normal. Patient is not sexually active. Contraception method is none. Postpartum depression screening: negative. ? ? ?The pregnancy intention screening data noted above was reviewed. Potential methods of contraception were discussed. The patient elected to proceed with No data recorded. ? ? Edinburgh Postnatal Depression Scale - 11/09/21 0918   ? ?  ? Edinburgh Postnatal Depression Scale:  In the Past 7 Days  ? I have been able to laugh and see the funny side of things. 0   ? I have looked forward with enjoyment to things. 0   ? I have blamed myself unnecessarily when things went wrong. 0   ? I have been anxious or worried for no good reason. 0   ? I have felt scared or panicky for no good reason. 0   ? Things have been getting on top of me. 0   ? I have been so unhappy that I have had difficulty sleeping. 0   ? I have  felt sad or miserable. 0   ? I have been so unhappy that I have been crying. 0   ? The thought of harming myself has occurred to me. 0   ? Edinburgh Postnatal Depression Scale Total 0   ? ?  ?  ? ?  ? ? ?The following portions of the patient's history were reviewed and updated as appropriate: allergies, current medications, past family history, past medical history, past social history, past surgical history, and problem list. ? ?Review of Systems ?Pertinent items are noted in HPI. ? ?Objective:  ?LMP 01/08/2021 (Exact Date)   Breastfeeding Unknown     ?General:  Alert, oriented and cooperative. Patient is in no acute distress.  ?Respiratory: Normal respiratory effort, no problems with respiration noted  ?Mental Status: Normal mood and affect. Normal behavior. Normal judgment and thought content.  ?Rest of physical exam deferred due to type of encounter   ?Assessment:  ? ? Normal postpartum exam. ? ?Plan:  ? Please see note from Amedeo Gory, CNM ? ?Vidal Schwalbe, CMA ?[facility], [facility]  ?

## 2021-11-13 ENCOUNTER — Other Ambulatory Visit: Payer: Managed Care, Other (non HMO)

## 2021-11-22 ENCOUNTER — Other Ambulatory Visit: Payer: Managed Care, Other (non HMO)

## 2022-11-30 IMAGING — US US MFM OB FOLLOW-UP
1 series · 14 of 28 positions shown · non-contrast
Comparison: none

[Series 1: us mfm ob follow-up · 42 acquisitions, 14 frames shown]
[im 2/42]
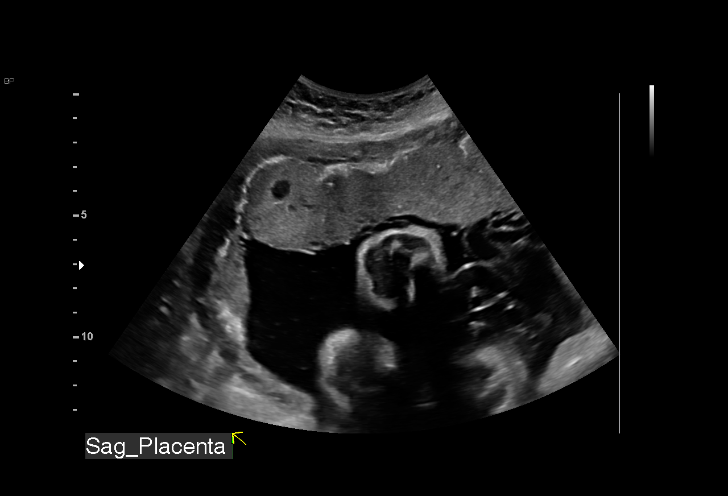
[im 5/42]
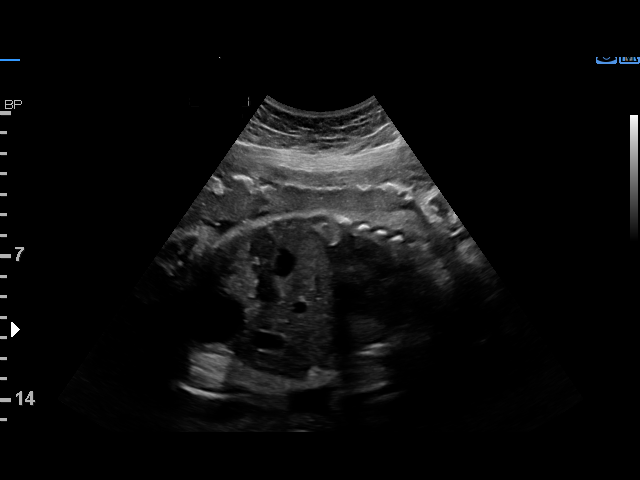
[im 8/42]
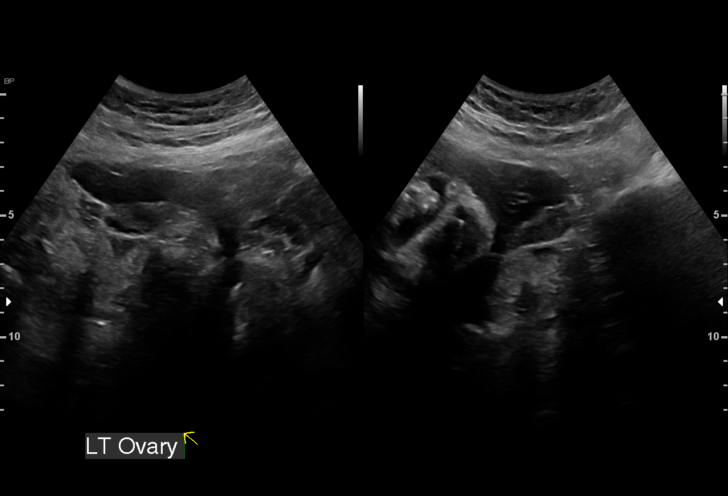
[im 11/42]
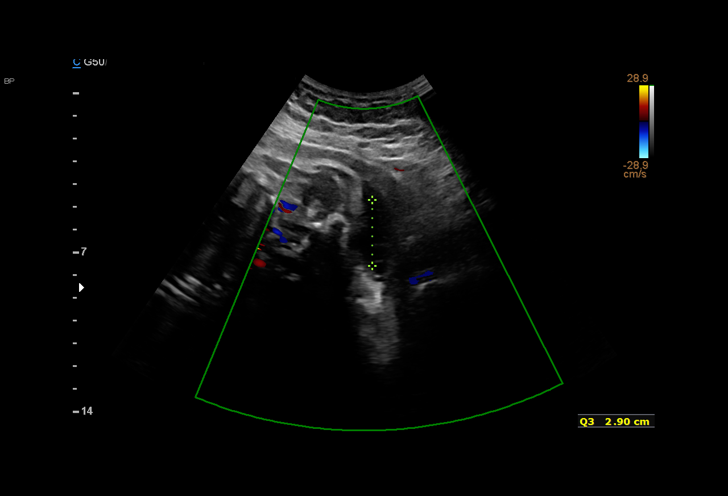
[im 14/42]
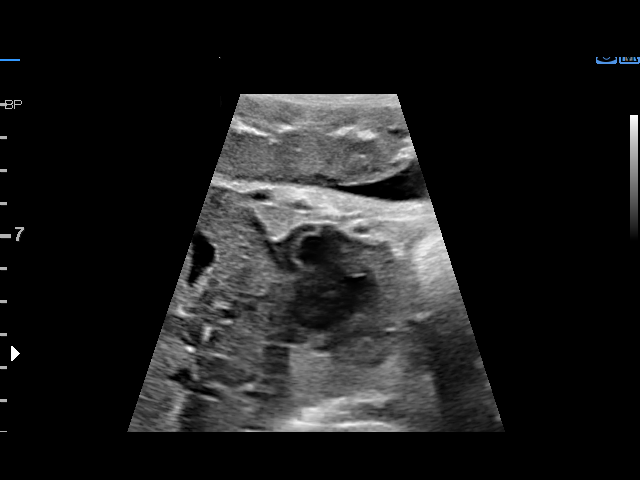
[im 17/42]
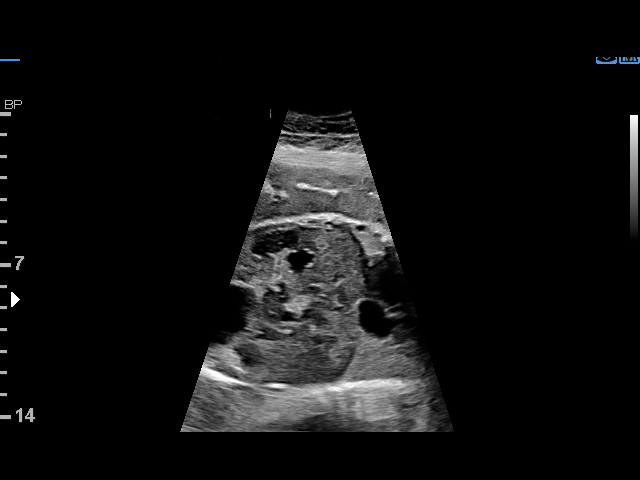
[im 20/42]
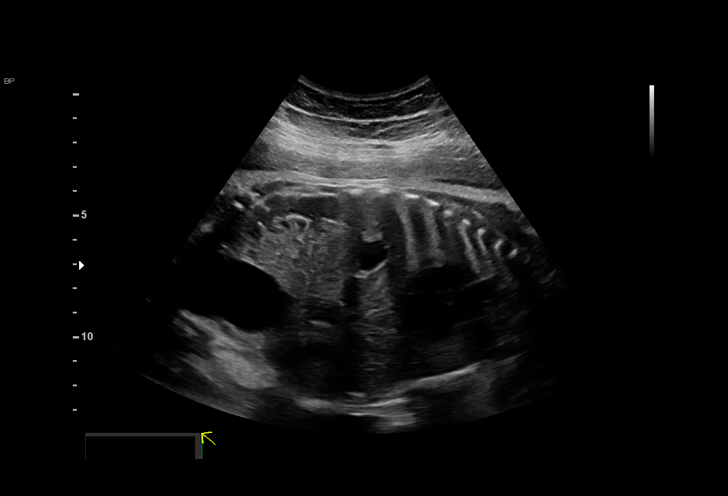
[im 23/42]
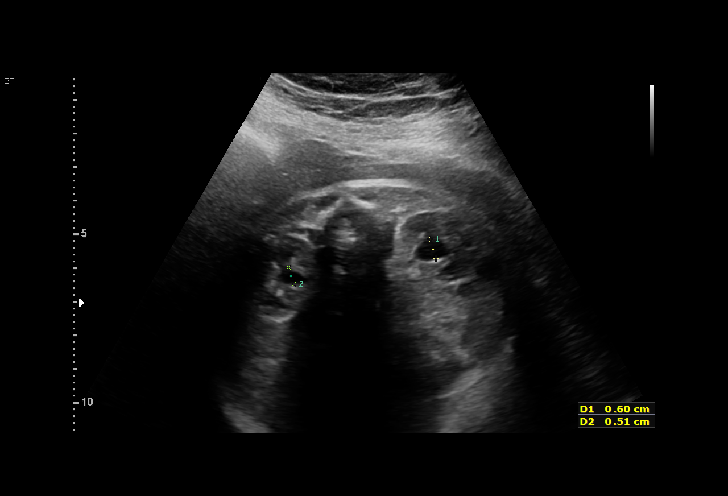
[im 26/42]
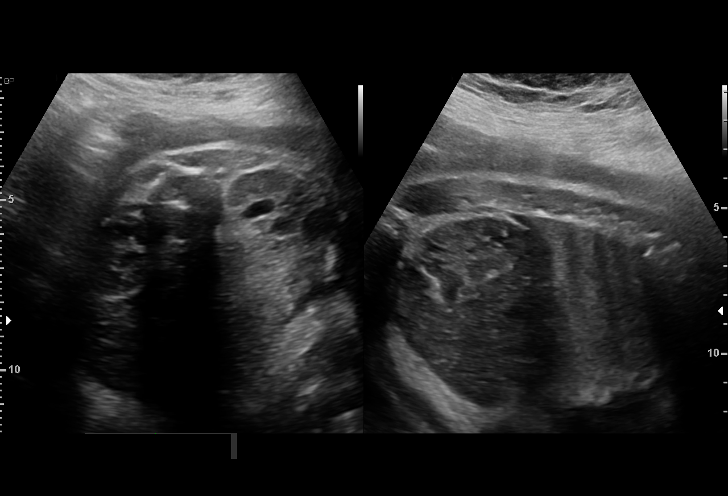
[im 29/42]
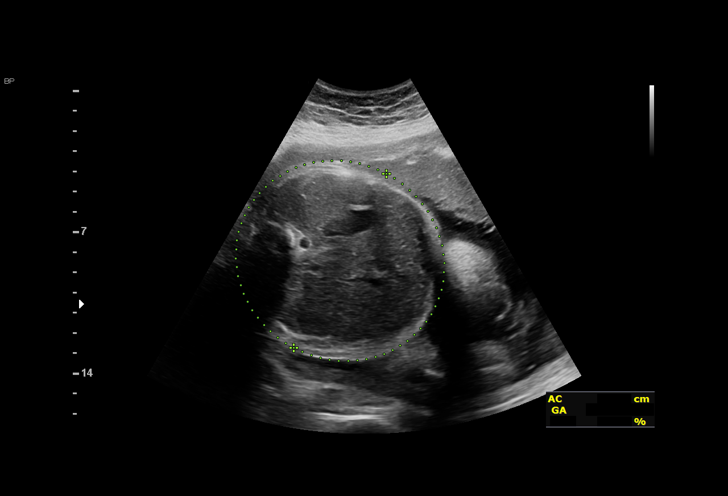
[im 32/42]
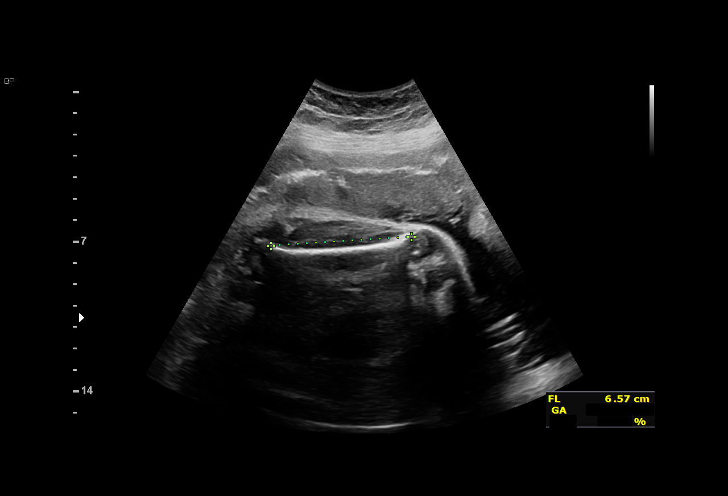
[im 35/42]
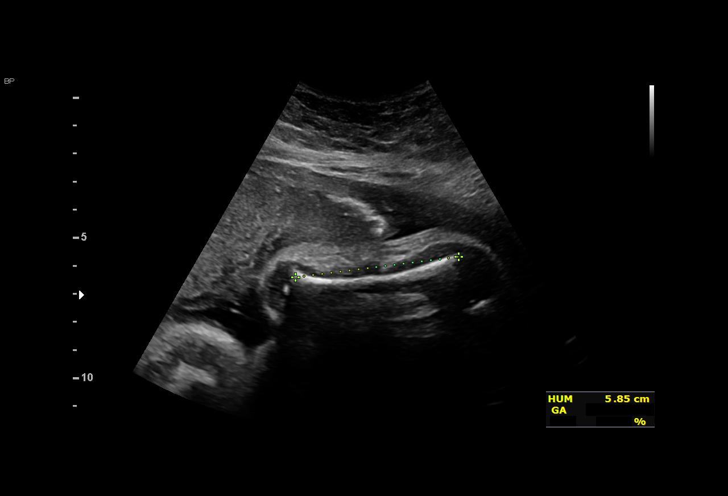
[im 38/42]
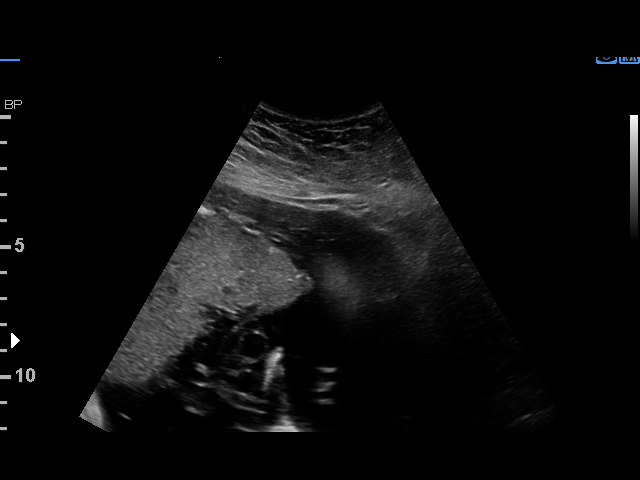
[im 42/42]
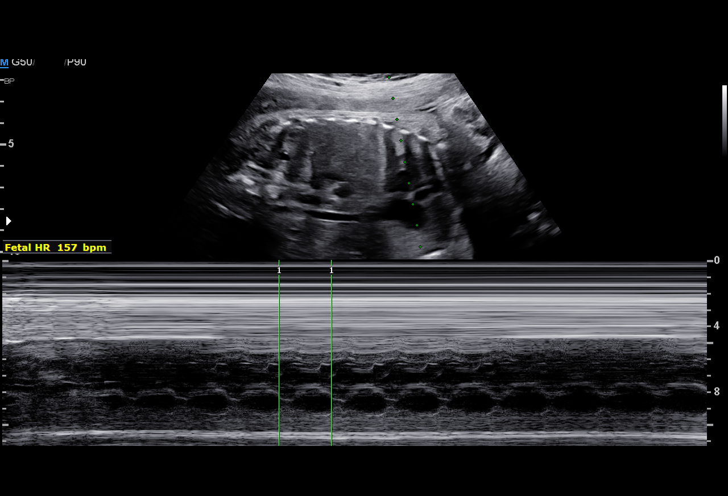

[14 of 28 positions shown; findings below may reference images not displayed]

Indications

 Family history of genetic disorder (tuberous
 sclerosis)
 Advanced maternal age multigravida 35+,
 third trimester (40 y.o.)
 Gestational diabetes in pregnancy, diet
 controlled
 34 weeks gestation of pregnancy
Fetal Evaluation

 Num Of Fetuses:         1
 Fetal Heart Rate(bpm):  157
 Cardiac Activity:       Observed
 Presentation:           Cephalic
 Placenta:               Anterior
 P. Cord Insertion:      Previously Visualized

 Amniotic Fluid
 AFI FV:      Within normal limits

 AFI Sum(cm)     %Tile       Largest Pocket(cm)
 12.71           40

 RUQ(cm)       RLQ(cm)       LUQ(cm)        LLQ(cm)
 0
Biometry
 BPD:      84.8  mm     G. Age:  34w 1d         38  %    CI:        73.21   %    70 - 86
                                                         FL/HC:      20.8   %    20.1 -
 HC:       315   mm     G. Age:  35w 2d         34  %    HC/AC:      0.99        0.93 -
 AC:      318.2  mm     G. Age:  35w 5d         85  %    FL/BPD:     77.2   %    71 - 87
 FL:       65.5  mm     G. Age:  33w 5d         21  %    FL/AC:      20.6   %    20 - 24
 HUM:      58.3  mm     G. Age:  33w 5d         47  %

 Est. FW:    5215  gm    5 lb 11 oz      59  %
OB History

 Gravidity:    3         Term:   2
 Living:       2
Gestational Age

 LMP:           34w 4d        Date:  01/08/21                 EDD:   10/15/21
 U/S Today:     34w 5d                                        EDD:   10/14/21
 Best:          34w 4d     Det. By:  LMP  (01/08/21)          EDD:   10/15/21
Anatomy

 Cranium:               Appears normal         Aortic Arch:            Previously seen
 Cavum:                 Previously seen        Ductal Arch:            Previously seen
 Ventricles:            Previously seen        Diaphragm:              Appears normal
 Choroid Plexus:        Previously seen        Stomach:                Appears normal, left
                                                                       sided
 Cerebellum:            Previously seen        Abdomen:                Previously seen
 Posterior Fossa:       Previously seen        Abdominal Wall:         Previously seen
 Nuchal Fold:           Not applicable (>20    Cord Vessels:           Previously seen
                        wks GA)
 Face:                  Orbits and profile     Kidneys:                Appear normal
                        previously seen
 Lips:                  Previously seen        Bladder:                Appears normal
 Thoracic:              Appears normal         Spine:                  Previously seen
 Heart:                 Appears normal         Upper Extremities:      Previously seen
                        (4CH, axis, and
                        situs)
 RVOT:                  Previously seen        Lower Extremities:      Previously seen
 LVOT:                  Previously seen

 Other:  Fetus appears to be a male. Nasal bone, Heels/feet and open
         hands/5th digits, VC, 3VV and 3VTV previously visualized.
Cervix Uterus Adnexa

 Cervix
 Not visualized (advanced GA >42wks)

 Uterus
 Normal shape and size.

 Right Ovary
 Within normal limits.

 Left Ovary
 Within normal limits.
 Cul De Sac
 No free fluid seen.

 Adnexa
 No adnexal mass visualized.
Impression

 Follow up growth due to O5LKS and AMA
 Normal interval growth with measurements consistent with
 dates
 Good fetal movement and amniotic fluid volume
Recommendations

 Follow up growth in 4 weeks.

## 2023-09-07 ENCOUNTER — Encounter (HOSPITAL_COMMUNITY): Payer: Self-pay | Admitting: Emergency Medicine

## 2023-09-07 ENCOUNTER — Emergency Department (HOSPITAL_COMMUNITY): Payer: Managed Care, Other (non HMO)

## 2023-09-07 ENCOUNTER — Other Ambulatory Visit: Payer: Self-pay

## 2023-09-07 ENCOUNTER — Emergency Department (HOSPITAL_COMMUNITY)
Admission: EM | Admit: 2023-09-07 | Discharge: 2023-09-07 | Disposition: A | Discharge status: Home / Self Care | Payer: Managed Care, Other (non HMO) | Attending: Emergency Medicine | Admitting: Emergency Medicine

## 2023-09-07 DIAGNOSIS — R0789 Other chest pain: Secondary | ICD-10-CM | POA: Diagnosis present

## 2023-09-07 DIAGNOSIS — F17200 Nicotine dependence, unspecified, uncomplicated: Secondary | ICD-10-CM | POA: Insufficient documentation

## 2023-09-07 DIAGNOSIS — R079 Chest pain, unspecified: Secondary | ICD-10-CM

## 2023-09-07 LAB — CBC
HCT: 46.9 % — ABNORMAL HIGH (ref 36.0–46.0)
Hemoglobin: 16.5 g/dL — ABNORMAL HIGH (ref 12.0–15.0)
MCH: 32.7 pg (ref 26.0–34.0)
MCHC: 35.2 g/dL (ref 30.0–36.0)
MCV: 93.1 fL (ref 80.0–100.0)
Platelets: 292 10*3/uL (ref 150–400)
RBC: 5.04 MIL/uL (ref 3.87–5.11)
RDW: 13.2 % (ref 11.5–15.5)
WBC: 10.8 10*3/uL — ABNORMAL HIGH (ref 4.0–10.5)
nRBC: 0 % (ref 0.0–0.2)

## 2023-09-07 LAB — TROPONIN I (HIGH SENSITIVITY)
Troponin I (High Sensitivity): 3 ng/L (ref <18)
Troponin I (High Sensitivity): 3 ng/L (ref <18)

## 2023-09-07 LAB — BASIC METABOLIC PANEL
Anion gap: 11 (ref 5–15)
BUN: 9 mg/dL (ref 6–20)
CO2: 20 mmol/L — ABNORMAL LOW (ref 22–32)
Calcium: 9.1 mg/dL (ref 8.9–10.3)
Chloride: 105 mmol/L (ref 98–111)
Creatinine, Ser: 0.57 mg/dL (ref 0.44–1.00)
GFR, Estimated: >60 mL/min (ref >60)
Glucose, Bld: 98 mg/dL (ref 70–99)
Potassium: 3.9 mmol/L (ref 3.5–5.1)
Sodium: 136 mmol/L (ref 135–145)

## 2023-09-07 LAB — HCG, SERUM, QUALITATIVE: Preg, Serum: NEGATIVE

## 2023-09-07 MED ORDER — OXYCODONE-ACETAMINOPHEN 5-325 MG PO TABS
1.0000 | ORAL_TABLET | Freq: Once | ORAL | Status: DC
  Filled 2023-09-07: qty 1

## 2023-09-07 MED ORDER — ALUM & MAG HYDROXIDE-SIMETH 200-200-20 MG/5ML PO SUSP
30.0000 mL | Freq: Once | ORAL | Status: AC
  Administered 2023-09-07: 30 mL via ORAL
  Filled 2023-09-07: qty 30

## 2023-09-07 MED ORDER — ONDANSETRON 4 MG PO TBDP: ORAL_TABLET | ORAL | 0 refills | Status: AC

## 2023-09-07 NOTE — ED Provider Notes (Signed)
[place] EMERGENCY DEPARTMENT AT [facility] Provider Note   CSN: [id number] Arrival date & time: 09/07/23  0121     History  Chief Complaint  Patient presents with   Chest Pain    Nancy Peterson is a 42 y.o. female.  42 yo F with a cc of chest pain.  Started after vomiting.  Patient had some pizza last night and she laid back soon after eating and then felt nauseated she had an episode of emesis got a headache got sweaty and had significant chest discomfort.  She did up calling 911 and was brought here with EMS.  Was given nitroglycerin with some improvement.  Patient denies history of MI, denies hypertension hyperlipidemia diabetes.  Patient is an everyday smoker.  Multiple MIs in her family mom had an MI in her 30s.   patient denies history of PE or DVT denies hemoptysis denies unilateral lower extremity edema denies recent surgery immobilization hospitalization estrogen use or history of cancer.     Chest Pain      Home Medications Prior to Admission medications   Medication Sig Start Date End Date Taking? Authorizing Provider  ondansetron (ZOFRAN-ODT) 4 MG disintegrating tablet 4mg  ODT q4 hours prn nausea/vomit 09/07/23  Yes Melene Plan, DO  acetaminophen (TYLENOL) 325 MG tablet Take 2 tablets (650 mg total) by mouth every 4 (four) hours as needed (for pain scale < 4). Patient not taking: Reported on 11/09/2021 10/13/21   Sharen Counter A, CNM  ibuprofen (ADVIL) 600 MG tablet Take 1 tablet (600 mg total) by mouth every 6 (six) hours. Patient not taking: Reported on 11/09/2021 10/13/21   Hurshel Party, CNM  nicotine polacrilex (NICORETTE) 4 MG gum Take 1 each (4 mg total) by mouth as needed for smoking cessation. Patient not taking: Reported on 11/09/2021 09/25/21   Milas Hock, MD  Prenatal Vit-Fe Fumarate-FA (PRENATAL PLUS VITAMIN/MINERAL) 27-1 MG TABS Take 1 tablet by mouth daily at 6 (six) AM. 02/27/21   Venora Maples, MD      Allergies     Penicillins    Review of Systems   Review of Systems  Cardiovascular:  Positive for chest pain.    Physical Exam Updated Vital Signs BP 115/79   Pulse 81   Temp 98 F (36.7 C)   Resp 16   Ht 5\' 3"  (1.6 m)   Wt 77.1 kg   SpO2 98%   BMI 30.11 kg/m  Physical Exam Vitals and nursing note reviewed.  Constitutional:      General: She is not in acute distress.    Appearance: She is well-developed. She is not diaphoretic.  HENT:     Head: Normocephalic and atraumatic.  Eyes:     Pupils: Pupils are equal, round, and reactive to light.  Cardiovascular:     Rate and Rhythm: Normal rate and regular rhythm.     Heart sounds: No murmur heard.    No friction rub. No gallop.  Pulmonary:     Effort: Pulmonary effort is normal.     Breath sounds: No wheezing or rales.  Abdominal:     General: There is no distension.     Palpations: Abdomen is soft.     Tenderness: There is no abdominal tenderness.  Musculoskeletal:        General: No tenderness.     Cervical back: Normal range of motion and neck supple.  Skin:    General: Skin is warm and dry.  Neurological:  Mental Status: She is alert and oriented to person, place, and time.  Psychiatric:        Behavior: Behavior normal.     ED Results / Procedures / Treatments   Labs (all labs ordered are listed, but only abnormal results are displayed) Labs Reviewed  BASIC METABOLIC PANEL - Abnormal; Notable for the following components:      Result Value   CO2 20 (*)    All other components within normal limits  CBC - Abnormal; Notable for the following components:   WBC 10.8 (*)    Hemoglobin 16.5 (*)    HCT 46.9 (*)    All other components within normal limits  HCG, SERUM, QUALITATIVE  TROPONIN I (HIGH SENSITIVITY)  TROPONIN I (HIGH SENSITIVITY)    EKG EKG Interpretation Date/Time:  Sunday September 07 2023 02:00:35 EST Ventricular Rate:  69 PR Interval:  146 QRS Duration:  82 QT Interval:  406 QTC  Calculation: 435 R Axis:   74  Text Interpretation: Normal sinus rhythm with sinus arrhythmia Cannot rule out Anterior infarct , age undetermined Abnormal ECG No significant change since last tracing Confirmed by Melene Plan [phone]) on 09/07/2023 7:05:11 AM  Radiology DG Chest 2 View Result Date: 09/07/2023 CLINICAL DATA:  Chest pain EXAM: CHEST - 2 VIEW COMPARISON:  12/06/2019 FINDINGS: The heart size and mediastinal contours are within normal limits. Both lungs are clear. The visualized skeletal structures are unremarkable. IMPRESSION: No active cardiopulmonary disease. Electronically Signed   By: Alcide Clever M.D.   On: 09/07/2023 02:00    Procedures Procedures   Discussed smoking cessation with patient and was they were offerred resources to help stop.  Total time was 5 min CPT code 47829.     Medications Ordered in ED Medications  oxyCODONE-acetaminophen (PERCOCET/ROXICET) 5-325 MG per tablet 1 tablet (1 tablet Oral Not Given 09/07/23 0211)  alum & mag hydroxide-simeth (MAALOX/MYLANTA) 200-200-20 MG/5ML suspension 30 mL (has no administration in time range)    ED Course/ Medical Decision Making/ A&P                                 Medical Decision Making Amount and/or Complexity of Data Reviewed Labs: ordered. Radiology: ordered.  Risk OTC drugs. Prescription drug management.   42 yo F with a chief complaint of chest pain.  By history this sounds most likely to be reflux.  She did eat and then very shortly after laid back flat and then had an episode of nausea and vomiting and had chest pain afterwards.  I think is unlikely to be Boerhaave syndrome.  She is feeling much better now.  2 troponins are negative.  Mild leukocytosis, no significant electrolyte abnormalities.  Chest x-ray independently interpreted by me without focal infiltrate or pneumothorax.  She is PERC negative.  Will discharge home.  Trial of H2 blockers.  PCP follow-up.  7:36 AM:  I have discussed the  diagnosis/risks/treatment options with the patient.  Evaluation and diagnostic testing in the emergency department does not suggest an emergent condition requiring admission or immediate intervention beyond what has been performed at this time.  They will follow up with PCP. We also discussed returning to the ED immediately if new or worsening sx occur. We discussed the sx which are most concerning (e.g., sudden worsening pain, fever, inability to tolerate by mouth) that necessitate immediate return. Medications administered to the patient during their visit and any new  prescriptions provided to the patient are listed below.  Medications given during this visit Medications  oxyCODONE-acetaminophen (PERCOCET/ROXICET) 5-325 MG per tablet 1 tablet (1 tablet Oral Not Given 09/07/23 0211)  alum & mag hydroxide-simeth (MAALOX/MYLANTA) 200-200-20 MG/5ML suspension 30 mL (has no administration in time range)     The patient appears reasonably screen and/or stabilized for discharge and I doubt any other medical condition or other [facility] requiring further screening, evaluation, or treatment in the ED at this time prior to discharge.          Final Clinical Impression(s) / ED Diagnoses Final diagnoses:  Nonspecific chest pain    Rx / DC Orders ED Discharge Orders          Ordered    ondansetron (ZOFRAN-ODT) 4 MG disintegrating tablet        09/07/23 0730              Melene Plan, DO 09/07/23 [phone]

## 2023-09-07 NOTE — Discharge Instructions (Addendum)
Try pepcid or tagamet up to twice a day.  Try to avoid things that may make this worse, most commonly these are spicy foods tomato based products fatty foods chocolate and peppermint.  Alcohol and tobacco can also make this worse.  Return to the emergency department for sudden worsening pain fever or inability to eat or drink.  

## 2023-09-07 NOTE — ED Triage Notes (Signed)
From home via [facility], cp that stared around 2100, cp, sob, headache, chills became worse. 324 asa, 0.4 nitroglycerin and zofran. IV established in the left AC. 146/86, hr 80, 100% Ra.

## 2023-09-07 NOTE — ED Provider Triage Note (Signed)
Emergency Medicine Provider Triage Evaluation Note  Nancy Peterson , a 42 y.o. female  was evaluated in triage.  Pt complains of nausea and  Chest pressure that woke her from her sleep .Marland Kitchen  Review of Systems  Positive: NBNB emesis, nausea, CP, SOB Negative: syncop  Physical Exam  BP 124/82   Pulse 73   Temp 98.2 F (36.8 C)   Resp 18   Ht 5\' 3"  (1.6 m)   Wt 77.1 kg   SpO2 97%   BMI 30.11 kg/m  Gen:   Awake, no distress   Resp:  Normal effort  MSK:   Moves extremities without difficulty  Other:  RRR no m/r/g. Lungs CTAB  Medical Decision Making  Medically screening exam initiated at 1:56 AM.  Appropriate orders placed.  Nancy Peterson was informed that the remainder of the evaluation will be completed by another provider, this initial triage assessment does not replace that evaluation, and the importance of remaining in the ED until their evaluation is complete.  This chart was dictated using voice recognition software, Dragon. Despite the best efforts of this provider to proofread and correct errors, errors may still occur which can change documentation meaning.    Paris Lore, PA-C 09/07/23 [phone]
